# Patient Record
Sex: Female | Born: 1975 | Race: Black or African American | Hispanic: No | Marital: Married | Smoking: Former smoker
Health system: Southern US, Community
[De-identification: ages and names within clinical notes are randomized; demographics above are authoritative.]

## PROBLEM LIST (undated history)

## (undated) DIAGNOSIS — M797 Fibromyalgia: Secondary | ICD-10-CM

## (undated) DIAGNOSIS — R1013 Epigastric pain: Secondary | ICD-10-CM

## (undated) DIAGNOSIS — J45909 Unspecified asthma, uncomplicated: Secondary | ICD-10-CM

## (undated) DIAGNOSIS — I639 Cerebral infarction, unspecified: Secondary | ICD-10-CM

## (undated) HISTORY — PX: DILATION AND CURETTAGE OF UTERUS: SHX78

## (undated) HISTORY — DX: Epigastric pain: R10.13

---

## 2013-08-11 ENCOUNTER — Emergency Department (HOSPITAL_COMMUNITY)
Admission: EM | Admit: 2013-08-11 | Discharge: 2013-08-11 | Disposition: A | Discharge status: Home / Self Care | Payer: Self-pay | Attending: Emergency Medicine | Admitting: Emergency Medicine

## 2013-08-11 ENCOUNTER — Encounter (HOSPITAL_COMMUNITY): Payer: Self-pay | Admitting: Emergency Medicine

## 2013-08-11 DIAGNOSIS — R509 Fever, unspecified: Secondary | ICD-10-CM | POA: Insufficient documentation

## 2013-08-11 DIAGNOSIS — J029 Acute pharyngitis, unspecified: Secondary | ICD-10-CM | POA: Insufficient documentation

## 2013-08-11 DIAGNOSIS — Z8673 Personal history of transient ischemic attack (TIA), and cerebral infarction without residual deficits: Secondary | ICD-10-CM | POA: Insufficient documentation

## 2013-08-11 DIAGNOSIS — Z3202 Encounter for pregnancy test, result negative: Secondary | ICD-10-CM | POA: Insufficient documentation

## 2013-08-11 DIAGNOSIS — H65199 Other acute nonsuppurative otitis media, unspecified ear: Secondary | ICD-10-CM | POA: Insufficient documentation

## 2013-08-11 DIAGNOSIS — J45909 Unspecified asthma, uncomplicated: Secondary | ICD-10-CM | POA: Insufficient documentation

## 2013-08-11 DIAGNOSIS — R5381 Other malaise: Secondary | ICD-10-CM | POA: Insufficient documentation

## 2013-08-11 DIAGNOSIS — R197 Diarrhea, unspecified: Secondary | ICD-10-CM | POA: Insufficient documentation

## 2013-08-11 DIAGNOSIS — J3489 Other specified disorders of nose and nasal sinuses: Secondary | ICD-10-CM | POA: Insufficient documentation

## 2013-08-11 DIAGNOSIS — J069 Acute upper respiratory infection, unspecified: Secondary | ICD-10-CM | POA: Insufficient documentation

## 2013-08-11 DIAGNOSIS — Z79899 Other long term (current) drug therapy: Secondary | ICD-10-CM | POA: Insufficient documentation

## 2013-08-11 HISTORY — DX: Cerebral infarction, unspecified: I63.9

## 2013-08-11 HISTORY — DX: Unspecified asthma, uncomplicated: J45.909

## 2013-08-11 LAB — COMPREHENSIVE METABOLIC PANEL
AST: 16 U/L (ref 0–37)
Albumin: 4.4 g/dL (ref 3.5–5.2)
Alkaline Phosphatase: 88 U/L (ref 39–117)
BUN: 9 mg/dL (ref 6–23)
CO2: 23 mEq/L (ref 19–32)
Calcium: 9.7 mg/dL (ref 8.4–10.5)
Chloride: 105 mEq/L (ref 96–112)
GFR calc non Af Amer: >90 mL/min (ref >90)
Glucose, Bld: 110 mg/dL — ABNORMAL HIGH (ref 70–99)
Potassium: 4 mEq/L (ref 3.5–5.1)
Total Bilirubin: 0.7 mg/dL (ref 0.3–1.2)

## 2013-08-11 LAB — CBC WITH DIFFERENTIAL/PLATELET
Basophils Relative: 0 % (ref 0–1)
Hemoglobin: 13.1 g/dL (ref 12.0–15.0)
Lymphocytes Relative: 18 % (ref 12–46)
Lymphs Abs: 1.9 10*3/uL (ref 0.7–4.0)
MCHC: 33.5 g/dL (ref 30.0–36.0)
MCV: 96.8 fL (ref 78.0–100.0)
Monocytes Relative: 6 % (ref 3–12)
Neutro Abs: 7.7 10*3/uL (ref 1.7–7.7)
Neutrophils Relative %: 73 % (ref 43–77)
RBC: 4.04 MIL/uL (ref 3.87–5.11)
RDW: 13.1 % (ref 11.5–15.5)
WBC: 10.5 10*3/uL (ref 4.0–10.5)

## 2013-08-11 LAB — URINALYSIS, ROUTINE W REFLEX MICROSCOPIC
Bilirubin Urine: NEGATIVE
Glucose, UA: NEGATIVE mg/dL
Ketones, ur: NEGATIVE mg/dL
Protein, ur: NEGATIVE mg/dL
Urobilinogen, UA: 2 mg/dL — ABNORMAL HIGH (ref 0.0–1.0)
pH: 7 (ref 5.0–8.0)

## 2013-08-11 LAB — URINE MICROSCOPIC-ADD ON

## 2013-08-11 LAB — LIPASE, BLOOD: Lipase: 30 U/L (ref 11–59)

## 2013-08-11 MED ORDER — ALBUTEROL SULFATE HFA 108 (90 BASE) MCG/ACT IN AERS
2.0000 | INHALATION_SPRAY | Freq: Once | RESPIRATORY_TRACT | Status: AC
  Administered 2013-08-11: 2 via RESPIRATORY_TRACT
  Filled 2013-08-11: qty 6.7

## 2013-08-11 MED ORDER — ALBUTEROL SULFATE HFA 108 (90 BASE) MCG/ACT IN AERS: 1.0000 | INHALATION_SPRAY | Freq: Four times a day (QID) | RESPIRATORY_TRACT | Status: DC | PRN

## 2013-08-11 MED ORDER — FLUTICASONE PROPIONATE 50 MCG/ACT NA SUSP: 2.0000 | Freq: Every day | NASAL | Status: DC

## 2013-08-11 MED ORDER — ALBUTEROL SULFATE HFA 108 (90 BASE) MCG/ACT IN AERS: 2.0000 | INHALATION_SPRAY | Freq: Once | RESPIRATORY_TRACT | Status: DC

## 2013-08-11 NOTE — ED Notes (Signed)
Pt c/o cough, sore throat and congestion started two days ago then this morning she had diarrhea and nausea. Pt denies abd pain.

## 2013-08-11 NOTE — ED Provider Notes (Signed)
CSN: [id number]     Arrival date & time 08/11/13  1004 History   First MD Initiated Contact with Patient 08/11/13 1047     Chief Complaint  Patient presents with  . Diarrhea  . Nausea  . Cough  . Nasal Congestion   (Consider location/radiation/quality/duration/timing/severity/associated sxs/prior Treatment) Patient is a 37 y.o. female presenting with cough. The history is provided by the patient. No language interpreter was used.  Cough Cough characteristics:  Productive Sputum characteristics:  Nondescript Severity:  Mild Onset quality:  Gradual Duration:  2 days Timing:  Intermittent Relieved by:  None tried Associated symptoms: fever and sinus congestion   Associated symptoms: no chills, no shortness of breath and no wheezing    Pt is a 37 year old female who presents with a 2 day history of cough, nasal congestion and ear fullness. She reports that this all started with nasal congestion and a cough that is worse at night time. She denies any wheezing event though her cough feels tight. She reports a mild sore throat and post-nasal drip. No difficulty swallowing or shortness of breath. She thinks she may have had a fever but no reported chills. She has an all-over feeling of malaise and has been eating and drinking in her normal pattern. She had one episode of diarrhea this morning. She denies abdominal pain, nausea or vomiting. No bloody emesis or blood in her stool.    Past Medical History  Diagnosis Date  . Asthma   . Stroke    Past Surgical History  Procedure Laterality Date  . Dilation and curettage of uterus     No family history on file. History  Substance Use Topics  . Smoking status: Never Smoker   . Smokeless tobacco: Never Used  . Alcohol Use: Not on file     Comment: occasion   OB History   Grav Para Term Preterm Abortions TAB SAB Ect Mult Living                 Review of Systems  Constitutional: Positive for fever. Negative for chills.  Respiratory:  Positive for cough. Negative for chest tightness, shortness of breath, wheezing and stridor.   Gastrointestinal: Positive for diarrhea. Negative for nausea, vomiting, abdominal pain, constipation and abdominal distention.  Genitourinary: Negative for dysuria.  All other systems reviewed and are negative.    Allergies  Benadryl  Home Medications   Current Outpatient Rx  Name  Route  Sig  Dispense  Refill  . OVER THE COUNTER MEDICATION   Oral   Take 1 capsule by mouth as needed (Cold and flu symptoms.). Over the counter cold and flu medication.         . phentermine 37.5 MG capsule   Oral   Take 37.5 mg by mouth every morning.         . topiramate (TOPAMAX) 100 MG tablet   Oral   Take 100 mg by mouth at bedtime.          BP 147/90  Pulse 87  Temp(Src) 98.9 F (37.2 C) (Oral)  Resp 19  SpO2 96%  LMP 07/30/2013 Physical Exam  Nursing note and vitals reviewed. Constitutional: She appears well-developed and well-nourished. No distress.  HENT:  Head: Normocephalic and atraumatic.  Nose: Mucosal edema present.  Mouth/Throat: Uvula is midline. Posterior oropharyngeal erythema present.  Serous fluid behind TM's bilaterally.  Eyes: Conjunctivae and EOM are normal.  Neck: Normal range of motion. Neck supple. No JVD present. No tracheal  deviation present. No thyromegaly present.  Cardiovascular: Normal rate, regular rhythm, normal heart sounds and intact distal pulses.   Pulmonary/Chest: Effort normal and breath sounds normal. No respiratory distress. She has no wheezes.  Abdominal: Soft. Bowel sounds are normal. She exhibits no distension. There is no tenderness.    ED Course  Procedures (including critical care time) Labs Review Labs Reviewed  COMPREHENSIVE METABOLIC PANEL - Abnormal; Notable for the following:    Glucose, Bld 110 (*)    All other components within normal limits  CBC WITH DIFFERENTIAL  LIPASE, BLOOD  URINALYSIS, ROUTINE W REFLEX MICROSCOPIC  POCT  PREGNANCY, URINE   Imaging Review No results found.  EKG Interpretation   None       MDM   1. URI (upper respiratory infection)     Afebrile, nasal congestion and general malaise. No wheezing or shortness of breath. Increased cough at night time. Probable URI. Flonase as directed and albuterol as needed. Use of humidifier at home may be helpful. Discussed plan of care and pt agreed. Return if symptoms worsen or no gradual improvement. Increase oral fluids and rest.    Irish Elders, NP 08/11/13 1546

## 2013-08-11 NOTE — Progress Notes (Signed)
[facility] provided pt with a list of primary care resources. Patient stated that she just moved to [facility] and was pending insurance through her job.

## 2013-08-12 LAB — URINE CULTURE

## 2013-08-14 NOTE — ED Provider Notes (Signed)
Medical screening examination/treatment/procedure(s) were performed by non-physician practitioner and as supervising physician I was immediately available for consultation/collaboration.  EKG Interpretation   None        Toy Baker, MD 08/14/13 [phone]

## 2013-12-03 ENCOUNTER — Emergency Department (HOSPITAL_COMMUNITY)
Admission: EM | Admit: 2013-12-03 | Discharge: 2013-12-03 | Disposition: A | Discharge status: Home / Self Care | Payer: Self-pay | Attending: Emergency Medicine | Admitting: Emergency Medicine

## 2013-12-03 ENCOUNTER — Encounter (HOSPITAL_COMMUNITY): Payer: Self-pay | Admitting: Emergency Medicine

## 2013-12-03 DIAGNOSIS — J069 Acute upper respiratory infection, unspecified: Secondary | ICD-10-CM | POA: Insufficient documentation

## 2013-12-03 DIAGNOSIS — Z79899 Other long term (current) drug therapy: Secondary | ICD-10-CM | POA: Insufficient documentation

## 2013-12-03 DIAGNOSIS — R059 Cough, unspecified: Secondary | ICD-10-CM | POA: Insufficient documentation

## 2013-12-03 DIAGNOSIS — Z888 Allergy status to other drugs, medicaments and biological substances status: Secondary | ICD-10-CM | POA: Insufficient documentation

## 2013-12-03 DIAGNOSIS — R6889 Other general symptoms and signs: Secondary | ICD-10-CM | POA: Insufficient documentation

## 2013-12-03 DIAGNOSIS — Z8673 Personal history of transient ischemic attack (TIA), and cerebral infarction without residual deficits: Secondary | ICD-10-CM | POA: Insufficient documentation

## 2013-12-03 DIAGNOSIS — J029 Acute pharyngitis, unspecified: Secondary | ICD-10-CM

## 2013-12-03 DIAGNOSIS — J45909 Unspecified asthma, uncomplicated: Secondary | ICD-10-CM | POA: Insufficient documentation

## 2013-12-03 DIAGNOSIS — R05 Cough: Secondary | ICD-10-CM | POA: Insufficient documentation

## 2013-12-03 DIAGNOSIS — H9209 Otalgia, unspecified ear: Secondary | ICD-10-CM | POA: Insufficient documentation

## 2013-12-03 DIAGNOSIS — J3489 Other specified disorders of nose and nasal sinuses: Secondary | ICD-10-CM | POA: Insufficient documentation

## 2013-12-03 MED ORDER — HYDROCODONE-ACETAMINOPHEN 7.5-325 MG/15ML PO SOLN: 10.0000 mL | Freq: Four times a day (QID) | ORAL | Status: DC | PRN

## 2013-12-03 NOTE — ED Provider Notes (Signed)
CSN: [id number]     Arrival date & time 12/03/13  1858 History  This chart was scribed for non-physician practitioner, Elpidio Anis, PA-C working with Merrie Roof, MD by Greggory Stallion, ED scribe. This patient was seen in room TR07C/TR07C and the patient's care was started at 7:47 PM.   Chief Complaint  Patient presents with  . Sore Throat  . Cough   The history is provided by the patient. No language interpreter was used.   HPI Comments: Marissa Benton is a 38 y.o. female who presents to the Emergency Department complaining of rhinorrhea, congestion and sore throat that started 2 days ago. She states cough and hoarseness started earlier today. Pt has taken mucinex and ibuprofen with no relief. She is also having mild, bilateral ear pain. Denies ear discharge. Pt had a low grade fever at 99.5 when she checked in but denies other fevers.   Past Medical History  Diagnosis Date  . Asthma   . Stroke    Past Surgical History  Procedure Laterality Date  . Dilation and curettage of uterus     No family history on file. History  Substance Use Topics  . Smoking status: Never Smoker   . Smokeless tobacco: Never Used  . Alcohol Use: Not on file     Comment: occasion   OB History   Grav Para Term Preterm Abortions TAB SAB Ect Mult Living                 Review of Systems  Constitutional: Negative for fever.  HENT: Positive for congestion, ear pain, rhinorrhea and sore throat. Negative for ear discharge.   Respiratory: Positive for cough.   All other systems reviewed and are negative.   Allergies  Benadryl  Home Medications   Current Outpatient Rx  Name  Route  Sig  Dispense  Refill  . albuterol (PROVENTIL HFA;VENTOLIN HFA) 108 (90 BASE) MCG/ACT inhaler   Inhalation   Inhale 1-2 puffs into the lungs every 6 (six) hours as needed for wheezing or shortness of breath.   1 Inhaler   0   . fluticasone (FLONASE) 50 MCG/ACT nasal spray   Each Nare   Place 2 sprays into  both nostrils daily.   16 g   2   . OVER THE COUNTER MEDICATION   Oral   Take 1 capsule by mouth as needed (Cold and flu symptoms.). Over the counter cold and flu medication.         . phentermine 37.5 MG capsule   Oral   Take 37.5 mg by mouth every morning.         . topiramate (TOPAMAX) 100 MG tablet   Oral   Take 100 mg by mouth at bedtime.          BP 135/88  Pulse 99  Temp(Src) 99.5 F (37.5 C) (Oral)  Resp 20  SpO2 99%  LMP 11/21/2013  Physical Exam  Nursing note and vitals reviewed. Constitutional: She is oriented to person, place, and time. She appears well-developed and well-nourished. No distress.  HENT:  Head: Normocephalic and atraumatic.  Right Ear: Tympanic membrane and ear canal normal.  Left Ear: Tympanic membrane and ear canal normal.  Mouth/Throat: Uvula is midline. Posterior oropharyngeal erythema present. No oropharyngeal exudate or posterior oropharyngeal edema.  Eyes: EOM are normal.  Neck: Neck supple. No tracheal deviation present.  Cardiovascular: Normal rate, regular rhythm and normal heart sounds.   Pulmonary/Chest: Effort normal and breath sounds normal.  No respiratory distress. She has no wheezes. She has no rales.  Musculoskeletal: Normal range of motion.  Neurological: She is alert and oriented to person, place, and time.  Skin: Skin is warm and dry.  Psychiatric: She has a normal mood and affect. Her behavior is normal.    ED Course  Procedures (including critical care time)  DIAGNOSTIC STUDIES: Oxygen Saturation is 99% on RA, normal by my interpretation.    COORDINATION OF CARE: 7:50 PM-Discussed treatment plan which includes cough medication, tylenol and ibuprofen with pt at bedside and pt agreed to plan.   Labs Review Labs Reviewed - No data to display Imaging Review No results found.   EKG Interpretation None      MDM   Final diagnoses:  None    1. URI 2. Pharyngitis  Uncomplicated URI without evidence  bacterial infection of throat or sinuses. VSS. Supportive care.  I personally performed the services described in this documentation, which was scribed in my presence. The recorded information has been reviewed and is accurate.  Arnoldo HookerShari A Shylah Dossantos, PA-C 12/03/13 2017

## 2013-12-03 NOTE — ED Notes (Signed)
Onset yesterday runny nose-clear and thin; sore throat-painful to swallow.  Onset today cough and hoarseness.  Took Mucinex and Ibuprofen with no relief. No swallowing or respiratory difficulties.

## 2013-12-03 NOTE — Discharge Instructions (Signed)
Antibiotic Nonuse  Your caregiver felt that the infection or problem was not one that would be helped with an antibiotic. Infections may be caused by viruses or bacteria. Only a caregiver can tell which one of these is the likely cause of an illness. A cold is the most common cause of infection in both adults and children. A cold is a virus. Antibiotic treatment will have no effect on a viral infection. Viruses can lead to many lost days of work caring for sick children and many missed days of school. Children may catch as many as 10 "colds" or "flus" per year during which they can be tearful, cranky, and uncomfortable. The goal of treating a virus is aimed at keeping the ill person comfortable. Antibiotics are medications used to help the body fight bacterial infections. There are relatively few types of bacteria that cause infections but there are hundreds of viruses. While both viruses and bacteria cause infection they are very different types of germs. A viral infection will typically go away by itself within 7 to 10 days. Bacterial infections may spread or get worse without antibiotic treatment. Examples of bacterial infections are:  Sore throats (like strep throat or tonsillitis).  Infection in the lung (pneumonia).  Ear and skin infections. Examples of viral infections are:  Colds or flus.  Most coughs and bronchitis.  Sore throats not caused by Strep.  Runny noses. It is often best not to take an antibiotic when a viral infection is the cause of the problem. Antibiotics can kill off the helpful bacteria that we have inside our body and allow harmful bacteria to start growing. Antibiotics can cause side effects such as allergies, nausea, and diarrhea without helping to improve the symptoms of the viral infection. Additionally, repeated uses of antibiotics can cause bacteria inside of our body to become resistant. That resistance can be passed onto harmful bacterial. The next time you have  an infection it may be harder to treat if antibiotics are used when they are not needed. Not treating with antibiotics allows our own immune system to develop and take care of infections more efficiently. Also, antibiotics will work better for [place] when they are prescribed for bacterial infections. Treatments for a child that is ill may include:  Give extra fluids throughout the day to stay hydrated.  Get plenty of rest.  Only give your child over-the-counter or prescription medicines for pain, discomfort, or fever as directed by your caregiver.  The use of a cool mist humidifier may help stuffy noses.  Cold medications if suggested by your caregiver. Your caregiver may decide to start you on an antibiotic if:  The problem you were seen for today continues for a longer length of time than expected.  You develop a secondary bacterial infection. SEEK MEDICAL CARE IF:  Fever lasts longer than 5 days.  Symptoms continue to get worse after 5 to 7 days or become severe.  Difficulty in breathing develops.  Signs of dehydration develop (poor drinking, rare urinating, dark colored urine).  Changes in behavior or worsening tiredness (listlessness or lethargy). Document Released: 10/26/2001 Document Revised: 11/09/2011 Document Reviewed: 04/24/2009 [facility] Patient Information 2014 [place], [place]. Sore Throat A sore throat is pain, burning, irritation, or scratchiness of the throat. There is often pain or tenderness when swallowing or talking. A sore throat may be accompanied by other symptoms, such as coughing, sneezing, fever, and swollen neck glands. A sore throat is often the first sign of another sickness, such as a  cold, flu, strep throat, or mononucleosis (commonly known as mono). Most sore throats go away without medical treatment. CAUSES  The most common causes of a sore throat include:  A viral infection, such as a cold, flu, or mono.  A bacterial infection, such as strep throat,  tonsillitis, or whooping cough.  Seasonal allergies.  Dryness in the air.  Irritants, such as smoke or pollution.  Gastroesophageal reflux disease (GERD). HOME CARE INSTRUCTIONS   Only take over-the-counter medicines as directed by your caregiver.  Drink enough fluids to keep your urine clear or pale yellow.  Rest as needed.  Try using throat sprays, lozenges, or sucking on hard candy to ease any pain (if older than 4 years or as directed).  Sip warm liquids, such as broth, herbal tea, or warm water with honey to relieve pain temporarily. You may also eat or drink cold or frozen liquids such as frozen ice pops.  Gargle with salt water (mix 1 tsp salt with 8 oz of water).  Do not smoke and avoid secondhand smoke.  Put a cool-mist humidifier in your bedroom at night to moisten the air. You can also turn on a hot shower and sit in the bathroom with the door closed for 5 10 minutes. SEEK IMMEDIATE MEDICAL CARE IF:  You have difficulty breathing.  You are unable to swallow fluids, soft foods, or your saliva.  You have increased swelling in the throat.  Your sore throat does not get better in 7 days.  You have nausea and vomiting.  You have a fever or persistent symptoms for more than 2 3 days.  You have a fever and your symptoms suddenly get worse. MAKE SURE YOU:   Understand these instructions.  Will watch your condition.  Will get help right away if you are not doing well or get worse. Document Released: 09/24/2004 Document Revised: 08/03/2012 Document Reviewed: 04/24/2012 [facility] Patient Information 2014 [place], [place]. Salt Water Gargle This solution will help make your mouth and throat feel better. HOME CARE INSTRUCTIONS   Mix 1 teaspoon of salt in 8 ounces of warm water.  Gargle with this solution as much or often as you need or as directed. Swish and gargle gently if you have any sores or wounds in your mouth.  Do not swallow this mixture. Document  Released: 05/21/2004 Document Revised: 11/09/2011 Document Reviewed: 10/12/2008 [facility] Patient Information 2014 [place], [place]. Upper Respiratory Infection, Adult An upper respiratory infection (URI) is also known as the common cold. It is often caused by a type of germ (virus). Colds are easily spread (contagious). You can pass it to others by kissing, coughing, sneezing, or drinking out of the same glass. Usually, you get better in 1 or 2 weeks.  HOME CARE   Only take medicine as told by your doctor.  Use a warm mist humidifier or breathe in steam from a hot shower.  Drink enough water and fluids to keep your pee (urine) clear or pale yellow.  Get plenty of rest.  Return to work when your temperature is back to normal or as told by your doctor. You may use a face mask and wash your hands to stop your cold from spreading. GET HELP RIGHT AWAY IF:   After the first few days, you feel you are getting worse.  You have questions about your medicine.  You have chills, shortness of breath, or brown or red spit (mucus).  You have yellow or brown snot (nasal discharge) or pain in the  face, especially when you bend forward.  You have a fever, puffy (swollen) neck, pain when you swallow, or white spots in the back of your throat.  You have a bad headache, ear pain, sinus pain, or chest pain.  You have a high-pitched whistling sound when you breathe in and out (wheezing).  You have a lasting cough or cough up blood.  You have sore muscles or a stiff neck. MAKE SURE YOU:   Understand these instructions.  Will watch your condition.  Will get help right away if you are not doing well or get worse. Document Released: 02/03/2008 Document Revised: 11/09/2011 Document Reviewed: 12/22/2010 [facility] Patient Information 2014 [place].

## 2013-12-05 NOTE — ED Provider Notes (Signed)
Medical screening examination/treatment/procedure(s) were performed by non-physician practitioner and as supervising physician I was immediately available for consultation/collaboration.   Mitzy Naron David Braheem Tomasik III, MD 12/05/13 0942 

## 2014-02-14 ENCOUNTER — Emergency Department (HOSPITAL_COMMUNITY): Payer: Self-pay

## 2014-02-14 ENCOUNTER — Encounter (HOSPITAL_COMMUNITY): Payer: Self-pay | Admitting: Emergency Medicine

## 2014-02-14 ENCOUNTER — Emergency Department (HOSPITAL_COMMUNITY)
Admission: EM | Admit: 2014-02-14 | Discharge: 2014-02-14 | Disposition: A | Discharge status: Home / Self Care | Payer: Self-pay | Attending: Emergency Medicine | Admitting: Emergency Medicine

## 2014-02-14 DIAGNOSIS — R748 Abnormal levels of other serum enzymes: Secondary | ICD-10-CM | POA: Insufficient documentation

## 2014-02-14 DIAGNOSIS — R1011 Right upper quadrant pain: Secondary | ICD-10-CM | POA: Insufficient documentation

## 2014-02-14 DIAGNOSIS — J45909 Unspecified asthma, uncomplicated: Secondary | ICD-10-CM | POA: Insufficient documentation

## 2014-02-14 DIAGNOSIS — R112 Nausea with vomiting, unspecified: Secondary | ICD-10-CM | POA: Insufficient documentation

## 2014-02-14 DIAGNOSIS — R8271 Bacteriuria: Secondary | ICD-10-CM

## 2014-02-14 DIAGNOSIS — R82998 Other abnormal findings in urine: Secondary | ICD-10-CM | POA: Insufficient documentation

## 2014-02-14 DIAGNOSIS — Z8673 Personal history of transient ischemic attack (TIA), and cerebral infarction without residual deficits: Secondary | ICD-10-CM | POA: Insufficient documentation

## 2014-02-14 LAB — CBC WITH DIFFERENTIAL/PLATELET
BASOS PCT: 0 % (ref 0–1)
Basophils Absolute: 0 10*3/uL (ref 0.0–0.1)
EOS ABS: 0 10*3/uL (ref 0.0–0.7)
Eosinophils Relative: 1 % (ref 0–5)
HCT: 39.3 % (ref 36.0–46.0)
HEMOGLOBIN: 13.1 g/dL (ref 12.0–15.0)
Lymphocytes Relative: 24 % (ref 12–46)
Lymphs Abs: 1.9 10*3/uL (ref 0.7–4.0)
MCH: 32.5 pg (ref 26.0–34.0)
MCHC: 33.3 g/dL (ref 30.0–36.0)
MCV: 97.5 fL (ref 78.0–100.0)
MONOS PCT: 6 % (ref 3–12)
Monocytes Absolute: 0.5 10*3/uL (ref 0.1–1.0)
NEUTROS ABS: 5.6 10*3/uL (ref 1.7–7.7)
Neutrophils Relative %: 69 % (ref 43–77)
PLATELETS: 259 10*3/uL (ref 150–400)
RBC: 4.03 MIL/uL (ref 3.87–5.11)
RDW: 12.4 % (ref 11.5–15.5)
WBC: 8 10*3/uL (ref 4.0–10.5)

## 2014-02-14 LAB — URINALYSIS, ROUTINE W REFLEX MICROSCOPIC
Bilirubin Urine: NEGATIVE
Glucose, UA: NEGATIVE mg/dL
Ketones, ur: NEGATIVE mg/dL
Nitrite: NEGATIVE
PROTEIN: NEGATIVE mg/dL
SPECIFIC GRAVITY, URINE: 1.03 (ref 1.005–1.030)
UROBILINOGEN UA: 1 mg/dL (ref 0.0–1.0)
pH: 6.5 (ref 5.0–8.0)

## 2014-02-14 LAB — BASIC METABOLIC PANEL
BUN: 9 mg/dL (ref 6–23)
CO2: 20 mEq/L (ref 19–32)
Calcium: 9.3 mg/dL (ref 8.4–10.5)
Chloride: 101 mEq/L (ref 96–112)
Creatinine, Ser: 0.62 mg/dL (ref 0.50–1.10)
GFR calc Af Amer: >90 mL/min (ref >90)
Glucose, Bld: 97 mg/dL (ref 70–99)
Potassium: 4.3 mEq/L (ref 3.7–5.3)
Sodium: 138 mEq/L (ref 137–147)

## 2014-02-14 LAB — HEPATIC FUNCTION PANEL
ALT: 15 U/L (ref 0–35)
AST: 16 U/L (ref 0–37)
Albumin: 4.5 g/dL (ref 3.5–5.2)
Alkaline Phosphatase: 74 U/L (ref 39–117)
TOTAL PROTEIN: 8.2 g/dL (ref 6.0–8.3)
Total Bilirubin: 0.5 mg/dL (ref 0.3–1.2)

## 2014-02-14 LAB — LIPASE, BLOOD: Lipase: 99 U/L — ABNORMAL HIGH (ref 11–59)

## 2014-02-14 LAB — URINE MICROSCOPIC-ADD ON

## 2014-02-14 LAB — HCG, SERUM, QUALITATIVE: PREG SERUM: NEGATIVE

## 2014-02-14 MED ORDER — HYDROCODONE-ACETAMINOPHEN 5-325 MG PO TABS: ORAL_TABLET | ORAL | Status: DC

## 2014-02-14 MED ORDER — KETOROLAC TROMETHAMINE 10 MG PO TABS: 10.0000 mg | ORAL_TABLET | Freq: Four times a day (QID) | ORAL | Status: DC | PRN

## 2014-02-14 MED ORDER — KETOROLAC TROMETHAMINE 30 MG/ML IJ SOLN
15.0000 mg | Freq: Once | INTRAMUSCULAR | Status: DC
  Filled 2014-02-14: qty 1

## 2014-02-14 MED ORDER — HYDROCODONE-ACETAMINOPHEN 5-325 MG PO TABS
1.0000 | ORAL_TABLET | Freq: Once | ORAL | Status: AC
  Administered 2014-02-14: 1 via ORAL
  Filled 2014-02-14: qty 1

## 2014-02-14 MED ORDER — KETOROLAC TROMETHAMINE 15 MG/ML IJ SOLN
15.0000 mg | Freq: Once | INTRAMUSCULAR | Status: AC
  Administered 2014-02-14: 15 mg via INTRAVENOUS

## 2014-02-14 MED ORDER — ACETAMINOPHEN 325 MG PO TABS
975.0000 mg | ORAL_TABLET | Freq: Once | ORAL | Status: AC
  Administered 2014-02-14: 975 mg via ORAL
  Filled 2014-02-14: qty 3

## 2014-02-14 NOTE — ED Notes (Signed)
Pt returned from X-ray.  

## 2014-02-14 NOTE — ED Notes (Addendum)
Pt states that she is still in pain and discomfort.

## 2014-02-14 NOTE — ED Notes (Signed)
Pt transported to Xray. 

## 2014-02-14 NOTE — ED Notes (Signed)
Pt reports pain to RUQ, under her breast that started on Sunday. Reports its tender to touch and feels swollen but denies a bump or abscess. Denies fever/chills. Mild nausea, no vomiting or diarrhea.

## 2014-02-14 NOTE — Discharge Planning (Signed)
[facility] Community Liaison  Spoke to patient about primary care resources and establishing care with a provider. Patient sts she will be obtaining insurance through her husbands employment in the upcoming weeks, and will be looking for a primary care provider. Patient was given a Facilities managerresource guide and my contact information for any future questions or concerns. Pt was instructed to contact me if she needed assistance finding a provider who accepts her new insurance.

## 2014-02-14 NOTE — ED Provider Notes (Signed)
CSN: [id number]     Arrival date & time 02/14/14  0920 History   None    Chief Complaint  Patient presents with  . Pain     (Consider location/radiation/quality/duration/timing/severity/associated sxs/prior Treatment) HPI  Marissa Benton is a 38 y.o. female complaining of right lower rib right upper quadrant pain onset 3 days ago associated with single episode of nonbloody, nonbilious, coffee-ground emesis. Patient states the pain is exacerbated by palpation, deep breathing and eating. She has nausea and decreased by mouth intake. Denies fever, chills, change in bowel or bladder habits, shortness of breath, cough, exogenous estrogen, history of DVT or PE, leg pain or swelling. States that she feels a lump in the area. She's been taking naproxen at home with little relief.  Past Medical History  Diagnosis Date  . Asthma   . Stroke    Past Surgical History  Procedure Laterality Date  . Dilation and curettage of uterus     History reviewed. No pertinent family history. History  Substance Use Topics  . Smoking status: Never Smoker   . Smokeless tobacco: Never Used  . Alcohol Use: Yes     Comment: occasion   OB History   Grav Para Term Preterm Abortions TAB SAB Ect Mult Living                 Review of Systems    Allergies  Benadryl  Home Medications   Prior to Admission medications   Medication Sig Start Date End Date Taking? Authorizing Masiel Gentzler  naproxen sodium (ANAPROX) 220 MG tablet Take 220 mg by mouth 2 (two) times daily as needed (pain).   Yes Historical Shlonda Dolloff, MD  HYDROcodone-acetaminophen (NORCO/VICODIN) 5-325 MG per tablet Take 1-2 tablets by mouth every 6 hours as needed for pain. 02/14/14   Nicole Pisciotta, PA-C  ketorolac (TORADOL) 10 MG tablet Take 1 tablet (10 mg total) by mouth every 6 (six) hours as needed (Take with food. Do not take more than 4 per day. Do not take for longer than 5 days). 02/14/14   Nicole Pisciotta, PA-C   BP 133/105  Pulse 78   Temp(Src) 98.5 F (36.9 C) (Oral)  Resp 14  SpO2 100%  LMP 01/31/2014 Physical Exam  Nursing note and vitals reviewed. Constitutional: She is oriented to person, place, and time. She appears well-developed and well-nourished. No distress.  HENT:  Head: Normocephalic.  Eyes: Conjunctivae and EOM are normal. Pupils are equal, round, and reactive to light.  Cardiovascular: Normal rate, regular rhythm and intact distal pulses.   Pulmonary/Chest: Effort normal and breath sounds normal. No stridor. No respiratory distress. She has no wheezes. She has no rales. She exhibits tenderness.    Abdominal: Soft. Bowel sounds are normal. She exhibits no distension and no mass. There is tenderness. There is no rebound and no guarding.  Mild tenderness palpation right upper quadrant with no guarding or rebound  Musculoskeletal: Normal range of motion. She exhibits no edema and no tenderness.  No calf asymmetry, superficial collaterals, palpable cords, edema, Homans sign negative bilaterally.    Neurological: She is alert and oriented to person, place, and time.  Skin: No rash noted.  No rash or skin changes  Psychiatric: She has a normal mood and affect.    ED Course  Procedures (including critical care time) Labs Review Labs Reviewed  LIPASE, BLOOD - Abnormal; Notable for the following:    Lipase 99 (*)    All other components within normal limits  URINALYSIS, ROUTINE W  REFLEX MICROSCOPIC - Abnormal; Notable for the following:    Color, Urine AMBER (*)    APPearance CLOUDY (*)    Hgb urine dipstick TRACE (*)    Leukocytes, UA MODERATE (*)    All other components within normal limits  URINE CULTURE  HCG, SERUM, QUALITATIVE  CBC WITH DIFFERENTIAL  BASIC METABOLIC PANEL  HEPATIC FUNCTION PANEL  URINE MICROSCOPIC-ADD ON    Imaging Review Dg Chest 2 View  02/14/2014   CLINICAL DATA:  Right chest pain  EXAM: CHEST  2 VIEW  COMPARISON:  None.  FINDINGS: Normal mediastinum and cardiac  silhouette. Normal pulmonary vasculature. No evidence of effusion, infiltrate, or pneumothorax. No acute bony abnormality.  IMPRESSION: Normal chest radiograph.   Electronically Signed   By: Genevive BiStewart  Edmunds M.D.   On: 02/14/2014 12:39   [place] Abdomen Complete  02/14/2014   CLINICAL DATA:  Right upper quadrant pain  EXAM: ULTRASOUND ABDOMEN COMPLETE  COMPARISON:  None.  FINDINGS: Gallbladder:  No gallstones or wall thickening visualized. No sonographic Murphy sign noted.  Common bile duct:  Diameter: 4 mm in diameter within normal limits.  Liver:  No focal lesion identified. Within normal limits in parenchymal echogenicity.  IVC:  No abnormality visualized.  Pancreas:  Visualized portion unremarkable.  Spleen:  Size and appearance within normal limits.  5 cm in length.  Right Kidney:  Length: 10.4 cm. Echogenicity within normal limits. No mass or hydronephrosis visualized.  Left Kidney:  Length: 10.4 cm. Echogenicity within normal limits. No mass or hydronephrosis visualized.  Abdominal aorta:  No aneurysm visualized.  Measures up to 2 cm in diameter.  Other findings:  None.  IMPRESSION: Normal abdominal ultrasound.   Electronically Signed   By: Natasha MeadLiviu  Pop M.D.   On: 02/14/2014 14:24     EKG Interpretation   Date/Time:  Wednesday February 14 2014 11:36:43 EDT Ventricular Rate:  77 PR Interval:    QRS Duration: 71 QT Interval:  365 QTC Calculation: 413 R Axis:   48 Text Interpretation:  Normal sinus rhythm Artifact no signs of ST/T  changes No old tracing to compare Confirmed by GOLDSTON  MD, SCOTT [phone]([id number])  on 02/14/2014 12:34:16 PM      MDM   Final diagnoses:  Elevated lipase  RUQ pain  Asymptomatic bacteriuria   Filed Vitals:   02/14/14 1145 02/14/14 1200 02/14/14 1336 02/14/14 1500  BP: 113/62 118/81 117/78 133/105  Pulse: 64 67 65 78  Temp:      TempSrc:      Resp: 14 13 15 14   SpO2: 99% 100% 100% 100%    Medications  HYDROcodone-acetaminophen (NORCO/VICODIN) 5-325 MG per tablet 1  tablet (not administered)  acetaminophen (TYLENOL) tablet 975 mg (975 mg Oral Given 02/14/14 1159)  ketorolac (TORADOL) 15 MG/ML injection 15 mg (15 mg Intravenous Given 02/14/14 1334)    Marissa Benton is a 38 y.o. female presenting with 3 days of pain underneath the right breast and right upper quadrant exacerbated by palpation and eating. Single episode of vomiting. No systemic signs of infection.  Serial abdominal exams remained nonsurgical. EKG and chest x-ray unremarkable. Patient is low risk by Wells criteria and PERC negative. Patient has a nonspecific elevation in lipase was reading of 99. She is tolerating by mouth. Abdominal ultrasound pending.   Patient's lipase is elevated at 99, this may be mild pancreatitis. She is tolerating by mouth uncomfortable. I don't think she requires admission. Ultrasound shows no abnormalities in the biliary tree. Patient's urinalysis shows  moderate leukocytes however she denies dysuria, urinary frequency. Asymptomatic bacteriuria, will not treat at this time.  Discussed case with attending MD who agrees with plan and stability to d/c to home.   Evaluation does not show pathology that would require ongoing emergent intervention or inpatient treatment. Pt is hemodynamically stable and mentating appropriately. Discussed findings and plan with patient/guardian, who agrees with care plan. All questions answered. Return precautions discussed and outpatient follow up given.   New Prescriptions   HYDROCODONE-ACETAMINOPHEN (NORCO/VICODIN) 5-325 MG PER TABLET    Take 1-2 tablets by mouth every 6 hours as needed for pain.   KETOROLAC (TORADOL) 10 MG TABLET    Take 1 tablet (10 mg total) by mouth every 6 (six) hours as needed (Take with food. Do not take more than 4 per day. Do not take for longer than 5 days).         Wynetta Emery, PA-C 02/14/14 1523

## 2014-02-14 NOTE — Discharge Instructions (Signed)
Take vicodin for breakthrough pain, do not drink alcohol, drive, care for children or do other critical tasks while taking vicodin.   Do not combine ketorolac (toradol) with any other NSAID (motrin, ibuprofen, Advil, aleve , aspirin, naproxen etc.) Take fist ketorolac in 6 hours, you have already had a shot today.    Do not hesitate to return to the Emergency Department for any new, worsening or concerning symptoms.   If you do not have a primary care doctor you can establish one at the   [facility]: [address] [place] [id number] [phone]  After you establish care. Let them know you were seen in the emergency room. They must obtain records for further management.

## 2014-02-14 NOTE — ED Notes (Signed)
Pt is getting ready to go toXray

## 2014-02-15 LAB — URINE CULTURE: Colony Count: >=100000

## 2014-02-16 NOTE — ED Provider Notes (Signed)
Medical screening examination/treatment/procedure(s) were performed by non-physician practitioner and as supervising physician I was immediately available for consultation/collaboration.   EKG Interpretation   Date/Time:  Wednesday February 14 2014 11:36:43 EDT Ventricular Rate:  77 PR Interval:    QRS Duration: 71 QT Interval:  365 QTC Calculation: 413 R Axis:   48 Text Interpretation:  Normal sinus rhythm Artifact no signs of ST/T  changes No old tracing to compare Confirmed by Jamielyn Petrucci  MD, Garret Teale ([id number])  on 02/14/2014 12:34:16 PM        Audree CamelScott T Olman Yono, MD 02/16/14 [phone]

## 2014-09-17 ENCOUNTER — Encounter (HOSPITAL_COMMUNITY): Payer: Self-pay | Admitting: Emergency Medicine

## 2014-09-17 ENCOUNTER — Emergency Department (HOSPITAL_COMMUNITY)
Admission: EM | Admit: 2014-09-17 | Discharge: 2014-09-17 | Disposition: A | Discharge status: Home / Self Care | Payer: Self-pay | Attending: Emergency Medicine | Admitting: Emergency Medicine

## 2014-09-17 DIAGNOSIS — Z3202 Encounter for pregnancy test, result negative: Secondary | ICD-10-CM | POA: Insufficient documentation

## 2014-09-17 DIAGNOSIS — Y9389 Activity, other specified: Secondary | ICD-10-CM | POA: Insufficient documentation

## 2014-09-17 DIAGNOSIS — Z8673 Personal history of transient ischemic attack (TIA), and cerebral infarction without residual deficits: Secondary | ICD-10-CM | POA: Insufficient documentation

## 2014-09-17 DIAGNOSIS — S29012A Strain of muscle and tendon of back wall of thorax, initial encounter: Secondary | ICD-10-CM | POA: Insufficient documentation

## 2014-09-17 DIAGNOSIS — J45909 Unspecified asthma, uncomplicated: Secondary | ICD-10-CM | POA: Insufficient documentation

## 2014-09-17 DIAGNOSIS — Y998 Other external cause status: Secondary | ICD-10-CM | POA: Insufficient documentation

## 2014-09-17 DIAGNOSIS — S161XXA Strain of muscle, fascia and tendon at neck level, initial encounter: Secondary | ICD-10-CM | POA: Insufficient documentation

## 2014-09-17 DIAGNOSIS — X58XXXA Exposure to other specified factors, initial encounter: Secondary | ICD-10-CM | POA: Insufficient documentation

## 2014-09-17 DIAGNOSIS — Y9289 Other specified places as the place of occurrence of the external cause: Secondary | ICD-10-CM | POA: Insufficient documentation

## 2014-09-17 DIAGNOSIS — S39012A Strain of muscle, fascia and tendon of lower back, initial encounter: Secondary | ICD-10-CM

## 2014-09-17 LAB — POC URINE PREG, ED: Preg Test, Ur: NEGATIVE

## 2014-09-17 MED ORDER — MORPHINE SULFATE 4 MG/ML IJ SOLN
6.0000 mg | Freq: Once | INTRAMUSCULAR | Status: AC
  Administered 2014-09-17: 6 mg via INTRAMUSCULAR
  Filled 2014-09-17: qty 2

## 2014-09-17 MED ORDER — IBUPROFEN 400 MG PO TABS
600.0000 mg | ORAL_TABLET | Freq: Once | ORAL | Status: AC
  Administered 2014-09-17: 600 mg via ORAL
  Filled 2014-09-17 (×2): qty 1

## 2014-09-17 MED ORDER — DIAZEPAM 5 MG PO TABS
5.0000 mg | ORAL_TABLET | Freq: Once | ORAL | Status: AC
  Administered 2014-09-17: 5 mg via ORAL
  Filled 2014-09-17: qty 1

## 2014-09-17 MED ORDER — NAPROXEN 375 MG PO TABS: 375.0000 mg | ORAL_TABLET | Freq: Two times a day (BID) | ORAL | Status: DC | PRN

## 2014-09-17 MED ORDER — DIAZEPAM 5 MG PO TABS: 5.0000 mg | ORAL_TABLET | Freq: Two times a day (BID) | ORAL | Status: DC | PRN

## 2014-09-17 NOTE — ED Provider Notes (Signed)
CSN: [id number]     Arrival date & time 09/17/14  [id number] History   First MD Initiated Contact with Patient 09/17/14 [phone]     Chief Complaint  Patient presents with  . Back Pain     (Consider location/radiation/quality/duration/timing/severity/associated sxs/prior Treatment) HPI Comments: 39 year old female with asthma, occasional alcohol history presents with lower neck and mid back tenderness bilateral. Gradually worsening for the past few days. 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 No urinary symptoms, fevers or injuries.   Patient is a 39 y.o. female presenting with back pain. The history is provided by the patient.  Back Pain Associated symptoms: no abdominal pain, no chest pain, no dysuria, no fever, no headaches, no numbness and no weakness     Past Medical History  Diagnosis Date  . Asthma   . Stroke    Past Surgical History  Procedure Laterality Date  . Dilation and curettage of uterus     No family history on file. History  Substance Use Topics  . Smoking status: Never Smoker   . Smokeless tobacco: Never Used  . Alcohol Use: Yes     Comment: occasion   OB History    No data available     Review of Systems  Constitutional: Negative for fever and chills.  HENT: Negative for congestion.   Eyes: Negative for visual disturbance.  Respiratory: Negative for shortness of breath.   Cardiovascular: Negative for chest pain.  Gastrointestinal: Negative for vomiting and abdominal pain.  Genitourinary: Negative for dysuria and flank pain.  Musculoskeletal: Positive for back pain and neck pain. Negative for neck stiffness.  Skin: Negative for rash.  Neurological: Negative for weakness, light-headedness, numbness and headaches.      Allergies  Benadryl  Home Medications   Prior to Admission medications    Medication Sig Start Date End Date Taking? Authorizing Provider  acetaminophen (TYLENOL) 650 MG CR tablet Take 1,300 mg by mouth daily as needed for pain.   Yes Historical Provider, MD  diazepam (VALIUM) 5 MG tablet Take 1 tablet (5 mg total) by mouth 2 (two) times daily as needed for muscle spasms. 09/17/14   Enid SkeensJoshua M Freda Jaquith, MD  HYDROcodone-acetaminophen (NORCO/VICODIN) 5-325 MG per tablet Take 1-2 tablets by mouth every 6 hours as needed for pain. Patient not taking: Reported on 09/17/2014 02/14/14   Joni ReiningNicole Pisciotta, PA-C  ketorolac (TORADOL) 10 MG tablet Take 1 tablet (10 mg total) by mouth every 6 (six) hours as needed (Take with food. Do not take more than 4 per day. Do not take for longer than 5 days). Patient not taking: Reported on 09/17/2014 02/14/14   Joni ReiningNicole Pisciotta, PA-C  naproxen (NAPROSYN) 375 MG tablet Take 1 tablet (375 mg total) by mouth 2 (two) times daily as needed. 09/17/14   Enid SkeensJoshua M Lerae Langham, MD   BP 118/80 mmHg  Pulse 86  Temp(Src) 98.7 F (37.1 C) (Oral)  Resp 12  Ht 5\' 2"  (1.575 m)  Wt 190 lb (86.183 kg)  BMI 34.74 kg/m2  SpO2 95%  LMP 08/19/2013 Physical Exam  Constitutional: She is oriented to person, place, and time. She appears well-developed and well-nourished.  HENT:  Head: Normocephalic and atraumatic.  Eyes: Conjunctivae are normal. Right eye exhibits no discharge. Left eye exhibits no discharge.  Neck: Normal range of motion. Neck supple. No tracheal deviation present.  Cardiovascular: Normal rate.   Pulmonary/Chest: Effort normal.  Abdominal: Soft. She exhibits no distension. There is no tenderness. There is no guarding.  Musculoskeletal: She exhibits tenderness. She exhibits no edema.  Patient has no midline tenderness in cervical thoracic or lumbar spine. Patient has tight paraspinal musculature lower thoracic and lower cervical, full range motion head and neck no meningismus.  Neurological: She is alert and oriented to person, place, and time.  Reflex  Scores:      Patellar reflexes are 1+ on the right side and 1+ on the left side.      Achilles reflexes are 1+ on the right side and 1+ on the left side. 5+ strength in  LE with f/e at major joints. Sensation to palpation intact in UE and LE. CNs 2-12 grossly intact  Skin: Skin is warm. No rash noted.  Psychiatric: She has a normal mood and affect.  Nursing note and vitals reviewed.   ED Course  Procedures (including critical care time) Labs Review Labs Reviewed  POC URINE PREG, ED    Imaging Review No results found.   EKG Interpretation None      MDM   Final diagnoses:  Back strain, initial encounter   Overall healthy patient presents with clinically musculoskeletal neck and back pain. No red flags. Discussed supportive care with time off work, muscle relaxants, anti-inflammatory and discuss strict reasons to return. Patient and family comfortable this plan. Very low pretest prob for significant pathology at this time.  Pt has tolerated nsaids in the past.  Results and differential diagnosis were discussed with the patient/parent/guardian. Close follow up outpatient was discussed, comfortable with the plan.   Medications  diazepam (VALIUM) tablet 5 mg (5 mg Oral Given 09/17/14 0825)  morphine 4 MG/ML injection 6 mg (6 mg Intramuscular Given 09/17/14 0834)  ibuprofen (ADVIL,MOTRIN) tablet 600 mg (600 mg Oral Given 09/17/14 0935)    Filed Vitals:   09/17/14 0803 09/17/14 0833 09/17/14 0837 09/17/14 0930  BP:  139/98  118/80  Pulse:   90 86  Temp: 98.7 F (37.1 C)     TempSrc: Oral     Resp:   13 12  Height:      Weight:      SpO2:   98% 95%    Final diagnoses:  Back strain, initial encounter        Enid Skeens, MD 09/17/14 [phone]

## 2014-09-17 NOTE — ED Notes (Signed)
Pt states on Thursday she started having pain in between her shoulder blades that goes into both shoulders.This morning pain woke pt up and is now radiating into lower back also.

## 2014-09-17 NOTE — Discharge Instructions (Signed)
If you were given medicines take as directed.  If you are on coumadin or contraceptives realize their levels and effectiveness is altered by many different medicines.  If you have any reaction (rash, tongues swelling, other) to the medicines stop taking and see a physician.   Please follow up as directed and return to the ER or see a physician for new or worsening symptoms.  Thank you. Filed Vitals:   09/17/14 0749 09/17/14 0803 09/17/14 0833 09/17/14 0837  BP: 145/97  139/98   Pulse: 96   90  Temp:  98.7 F (37.1 C)    TempSrc:  Oral    Resp: 16   13  Height: 5\' 2"  (1.575 m)     Weight: 190 lb (86.183 kg)     SpO2: 100%   98%

## 2014-11-07 ENCOUNTER — Emergency Department (HOSPITAL_COMMUNITY)
Admission: EM | Admit: 2014-11-07 | Discharge: 2014-11-07 | Disposition: A | Discharge status: Home / Self Care | Payer: Self-pay | Attending: Emergency Medicine | Admitting: Emergency Medicine

## 2014-11-07 ENCOUNTER — Encounter (HOSPITAL_COMMUNITY): Payer: Self-pay | Admitting: Emergency Medicine

## 2014-11-07 DIAGNOSIS — K0889 Other specified disorders of teeth and supporting structures: Secondary | ICD-10-CM

## 2014-11-07 DIAGNOSIS — J45909 Unspecified asthma, uncomplicated: Secondary | ICD-10-CM | POA: Insufficient documentation

## 2014-11-07 DIAGNOSIS — Z8673 Personal history of transient ischemic attack (TIA), and cerebral infarction without residual deficits: Secondary | ICD-10-CM | POA: Insufficient documentation

## 2014-11-07 DIAGNOSIS — K029 Dental caries, unspecified: Secondary | ICD-10-CM | POA: Insufficient documentation

## 2014-11-07 DIAGNOSIS — K088 Other specified disorders of teeth and supporting structures: Secondary | ICD-10-CM | POA: Insufficient documentation

## 2014-11-07 MED ORDER — OXYCODONE-ACETAMINOPHEN 5-325 MG PO TABS: 1.0000 | ORAL_TABLET | ORAL | Status: DC | PRN

## 2014-11-07 MED ORDER — PENICILLIN V POTASSIUM 250 MG PO TABS
500.0000 mg | ORAL_TABLET | Freq: Once | ORAL | Status: AC
  Administered 2014-11-07: 500 mg via ORAL
  Filled 2014-11-07: qty 2

## 2014-11-07 MED ORDER — PENICILLIN V POTASSIUM 500 MG PO TABS: 500.0000 mg | ORAL_TABLET | Freq: Four times a day (QID) | ORAL | Status: DC

## 2014-11-07 MED ORDER — OXYCODONE-ACETAMINOPHEN 5-325 MG PO TABS
2.0000 | ORAL_TABLET | Freq: Once | ORAL | Status: AC
  Administered 2014-11-07: 2 via ORAL
  Filled 2014-11-07 (×2): qty 2

## 2014-11-07 MED ORDER — NAPROXEN 375 MG PO TABS: 375.0000 mg | ORAL_TABLET | Freq: Two times a day (BID) | ORAL | Status: DC | PRN

## 2014-11-07 NOTE — ED Provider Notes (Signed)
CSN: [id number]     Arrival date & time 11/07/14  [id number] History  This chart was scribed for Marisa Severinlga Barbar Brede, MD by Bronson CurbJacqueline Melvin, ED Scribe. This patient was seen in room B19C/B19C and the patient's care was started at 3:45 AM.   Chief Complaint  Patient presents with  . Otalgia    The history is provided by the patient. No language interpreter was used.     HPI Comments: Marissa Benton is a [age] y.o. female, with history of asthma, who presents to the Emergency Department complaining of constant, worsening, 9/10 left ear pain that radiates to the left jaw for the past 3 days. There is associated left sided facial swelling. Patient states she has been unable to chew. She has tried Naproxen without significant relief. She denies putting anything in her ear for treatment. Patient notes cold symptoms 2 weeks ago. She denies fever.    Past Medical History  Diagnosis Date  . Asthma   . Stroke    Past Surgical History  Procedure Laterality Date  . Dilation and curettage of uterus     No family history on file. History  Substance Use Topics  . Smoking status: Never Smoker   . Smokeless tobacco: Never Used  . Alcohol Use: Yes     Comment: occasion   OB History    No data available     Review of Systems  Constitutional: Negative for fever.  HENT: Positive for ear pain (left) and facial swelling.        Jaw pain      Allergies  Benadryl  Home Medications   Prior to Admission medications   Medication Sig Start Date End Date Taking? Authorizing Provider  acetaminophen (TYLENOL) 650 MG CR tablet Take 1,300 mg by mouth daily as needed for pain.    Historical Provider, MD  diazepam (VALIUM) 5 MG tablet Take 1 tablet (5 mg total) by mouth 2 (two) times daily as needed for muscle spasms. 09/17/14   Blane OharaJoshua Zavitz, MD  HYDROcodone-acetaminophen (NORCO/VICODIN) 5-325 MG per tablet Take 1-2 tablets by mouth every 6 hours as needed for pain. Patient not taking: Reported on 09/17/2014 02/14/14    Joni ReiningNicole Pisciotta, PA-C  ketorolac (TORADOL) 10 MG tablet Take 1 tablet (10 mg total) by mouth every 6 (six) hours as needed (Take with food. Do not take more than 4 per day. Do not take for longer than 5 days). Patient not taking: Reported on 09/17/2014 02/14/14   Joni ReiningNicole Pisciotta, PA-C  naproxen (NAPROSYN) 375 MG tablet Take 1 tablet (375 mg total) by mouth 2 (two) times daily as needed. 09/17/14   Blane OharaJoshua Zavitz, MD   Triage Vitals: BP 142/96 mmHg  Pulse 88  Temp(Src) 98.5 F (36.9 C) (Oral)  Resp 16  SpO2 98%  LMP 10/24/2014  Physical Exam  Constitutional: She is oriented to person, place, and time. She appears well-developed and well-nourished. No distress.  HENT:  Head: Normocephalic and atraumatic.  Right Ear: External ear normal.  Left Ear: External ear normal.  Nose: Nose normal.  Mouth/Throat: Oropharynx is clear and moist.  Lymphadenopathy under left jaw. Dental decay with swelling around 2nd molar left lower jaw  Eyes: Conjunctivae and EOM are normal. Pupils are equal, round, and reactive to light.  Neck: Normal range of motion. Neck supple. No JVD present. No tracheal deviation present. No thyromegaly present.  Cardiovascular: Normal rate, regular rhythm, normal heart sounds and intact distal pulses.  Exam reveals no gallop and no friction rub.  No murmur heard. Pulmonary/Chest: Effort normal and breath sounds normal. No stridor. No respiratory distress. She has no wheezes. She has no rales. She exhibits no tenderness.  Abdominal: Soft. Bowel sounds are normal. She exhibits no distension and no mass. There is no tenderness. There is no rebound and no guarding.  Musculoskeletal: Normal range of motion. She exhibits no edema or tenderness.  Lymphadenopathy:    She has no cervical adenopathy.  Neurological: She is alert and oriented to person, place, and time. She displays normal reflexes. She exhibits normal muscle tone. Coordination normal.  Skin: Skin is warm and dry. No  rash noted. No erythema. No pallor.  Psychiatric: She has a normal mood and affect. Her behavior is normal. Judgment and thought content normal.  Nursing note and vitals reviewed.   ED Course  Procedures (including critical care time)  DIAGNOSTIC STUDIES: Oxygen Saturation is 98% on room air, normal by my interpretation.    COORDINATION OF CARE: At [phone] Discussed treatment plan with patient which includes pain medication and ABX. Patient agrees.   Labs Review Labs Reviewed - No data to display  Imaging Review No results found.   EKG Interpretation None      MDM   Final diagnoses:  Pain, dental    39 year old female with left-sided face and ear pain, physical exam shows dental decay and most likely dental infection of second molar.  No signs of ear infection.  Plan for penicillin, Percocet and close follow-up with dentist.  I personally performed the services described in this documentation, which was scribed in my presence. The recorded information has been reviewed and is accurate.     Marisa Severin, MD 11/07/14 [phone]

## 2014-11-07 NOTE — ED Notes (Signed)
Pt presents with left ear pain onset Monday- pt admits pain radiates into left side of jaw and into mouth.

## 2014-11-07 NOTE — Discharge Instructions (Signed)
Dental Care and Dentist Visits °Dental care supports good overall health. Regular dental visits can also help you avoid dental pain, bleeding, infection, and other more serious health problems in the future. It is important to keep the mouth healthy because diseases in the teeth, gums, and other oral tissues can spread to other areas of the body. Some problems, such as diabetes, heart disease, and pre-term labor have been associated with poor oral health.  °See your dentist every 6 months. If you experience emergency problems such as a toothache or broken tooth, go to the dentist right away. If you see your dentist regularly, you may catch problems early. It is easier to be treated for problems in the early stages.  °WHAT TO EXPECT AT A DENTIST VISIT  °Your dentist will look for many common oral health problems and recommend proper treatment. At your regular dental visit, you can expect: °· Gentle cleaning of the teeth and gums. This includes scraping and polishing. This helps to remove the sticky substance around the teeth and gums (plaque). Plaque forms in the mouth shortly after eating. Over time, plaque hardens on the teeth as tartar. If tartar is not removed regularly, it can cause problems. Cleaning also helps remove stains. °· Periodic X-rays. These pictures of the teeth and supporting bone will help your dentist assess the health of your teeth. °· Periodic fluoride treatments. Fluoride is a natural mineral shown to help strengthen teeth. Fluoride treatment involves applying a fluoride gel or varnish to the teeth. It is most commonly done in children. °· Examination of the mouth, tongue, jaws, teeth, and gums to look for any oral health problems, such as: °· Cavities (dental caries). This is decay on the tooth caused by plaque, sugar, and acid in the mouth. It is best to catch a cavity when it is small. °· Inflammation of the gums caused by plaque buildup (gingivitis). °· Problems with the mouth or malformed  or misaligned teeth. °· Oral cancer or other diseases of the soft tissues or jaws.  °KEEP YOUR TEETH AND GUMS HEALTHY °For healthy teeth and gums, follow these general guidelines as well as your dentist's specific advice: °· Have your teeth professionally cleaned at the dentist every 6 months. °· Brush twice daily with a fluoride toothpaste. °· Floss your teeth daily.  °· Ask your dentist if you need fluoride supplements, treatments, or fluoride toothpaste. °· Eat a healthy diet. Reduce foods and drinks with added sugar. °· Avoid smoking. °TREATMENT FOR ORAL HEALTH PROBLEMS °If you have oral health problems, treatment varies depending on the conditions present in your teeth and gums. °· Your caregiver will most likely recommend good oral hygiene at each visit. °· For cavities, gingivitis, or other oral health disease, your caregiver will perform a procedure to treat the problem. This is typically done at a separate appointment. Sometimes your caregiver will refer you to another dental specialist for specific tooth problems or for surgery. °SEEK IMMEDIATE DENTAL CARE IF: °· You have pain, bleeding, or soreness in the gum, tooth, jaw, or mouth area. °· A permanent tooth becomes loose or separated from the gum socket. °· You experience a blow or injury to the mouth or jaw area. °Document Released: 04/29/2011 Document Revised: 11/09/2011 Document Reviewed: 04/29/2011 °ExitCare® Patient Information ©2015 [employer]. This information is not intended to replace advice given to you by your health care provider. Make sure you discuss any questions you have with your health care provider. ° °Dental Pain °A tooth ache may be caused   by cavities (tooth decay). Cavities expose the nerve of the tooth to air and hot or cold temperatures. It may come from an infection or abscess (also called a boil or furuncle) around your tooth. It is also often caused by dental caries (tooth decay). This causes the pain you are  having. DIAGNOSIS  Your caregiver can diagnose this problem by exam. TREATMENT   If caused by an infection, it may be treated with medications which kill germs (antibiotics) and pain medications as prescribed by your caregiver. Take medications as directed.  Only take over-the-counter or prescription medicines for pain, discomfort, or fever as directed by your caregiver.  Whether the tooth ache today is caused by infection or dental disease, you should see your dentist as soon as possible for further care. SEEK MEDICAL CARE IF: The exam and treatment you received today has been provided on an emergency basis only. This is not a substitute for complete medical or dental care. If your problem worsens or new problems (symptoms) appear, and you are unable to meet with your dentist, call or return to this location. SEEK IMMEDIATE MEDICAL CARE IF:   You have a fever.  You develop redness and swelling of your face, jaw, or neck.  You are unable to open your mouth.  You have severe pain uncontrolled by pain medicine. MAKE SURE YOU:   Understand these instructions.  Will watch your condition.  Will get help right away if you are not doing well or get worse. Document Released: 08/17/2005 Document Revised: 11/09/2011 Document Reviewed: 04/04/2008 [facility] Patient Information 2015 [place], [place]. This information is not intended to replace advice given to you by your health care provider. Make sure you discuss any questions you have with your health care provider.    Dental Care: Organization         Address  Phone  Notes  [facility] Department of [facility] [address] [place], [place] [phone] Accepts children up to age 77 who are enrolled in [place] or [place] Health Choice; pregnant women with a Medicaid card; and children who have applied for Medicaid or [place] Health Choice, but were declined, whose parents can pay a reduced fee at time of service.   [facility] Department of [facility]  [address], [place] [phone] Accepts children up to age 23 who are enrolled in [place] or [place] Health Choice; pregnant women with a Medicaid card; and children who have applied for Medicaid or [place] Health Choice, but were declined, whose parents can pay a reduced fee at time of service.  [facility]  [address] [place], [place] [phone] Patients are seen by appointment only. Walk-ins are not accepted. [facility] will see patients 73 years of age and older. Monday - Tuesday (8am-5pm) Most Wednesdays (8:30-5pm) $30 per visit, cash only  [facility]  [address], [facility] [phone] Patients are seen by appointment only. Walk-ins are not accepted. [facility] will see patients 74 years of age and older. One Wednesday Evening (Monthly: Volunteer Based).  $30 per visit, cash only  [employer]  [phone] for adults; Children under age 60, call Graduate Pediatric Dentistry at [phone]. Children aged 67-14, please call [phone] to request a pediatric application.  Dental services are provided in all areas of dental care including fillings, crowns and bridges, complete and partial dentures, implants, gum treatment,  root canals, and extractions. Preventive care is also provided. Treatment is provided to both adults and children. Patients are selected via a lottery and there is often a waiting list.   [facility] [address], [place]  [phone] [email or web address]   [facility] [address], [place], [place] [phone], Ext. [phone] Second and Fourth Thursday of each month, opens at 6:30 AM; Clinic ends at 9 AM.  Patients are seen on a first-come first-served basis, and a limited number are seen during each clinic.   [facility]  [address], [place], [place] [phone]  [phone]   Eligibility Requirements You must have lived in [place], [place], or [place] for at least the last three months.   You cannot be eligible for state or federal sponsored [place] insurance, including [employer], [place], or [employer]are.   You generally cannot be eligible for healthcare insurance through your employer.    How to apply: Eligibility screenings are held every Tuesday and Wednesday afternoon from 1:00 pm until 4:00 pm. You do not need an appointment for the interview!  [facility] [address], [place], [place] [phone]   [facility]  [phone]   [facility]  [phone]   [facility]  [phone]

## 2014-11-07 NOTE — ED Notes (Signed)
Pt a/o x 4 on d/c in wheelchair with family. 

## 2014-12-10 ENCOUNTER — Emergency Department (HOSPITAL_COMMUNITY)
Admission: EM | Admit: 2014-12-10 | Discharge: 2014-12-10 | Disposition: A | Discharge status: Home / Self Care | Payer: Self-pay | Attending: Emergency Medicine | Admitting: Emergency Medicine

## 2014-12-10 ENCOUNTER — Encounter (HOSPITAL_COMMUNITY): Payer: Self-pay | Admitting: Emergency Medicine

## 2014-12-10 DIAGNOSIS — R21 Rash and other nonspecific skin eruption: Secondary | ICD-10-CM | POA: Insufficient documentation

## 2014-12-10 DIAGNOSIS — Z791 Long term (current) use of non-steroidal anti-inflammatories (NSAID): Secondary | ICD-10-CM | POA: Insufficient documentation

## 2014-12-10 DIAGNOSIS — Z792 Long term (current) use of antibiotics: Secondary | ICD-10-CM | POA: Insufficient documentation

## 2014-12-10 DIAGNOSIS — Z8673 Personal history of transient ischemic attack (TIA), and cerebral infarction without residual deficits: Secondary | ICD-10-CM | POA: Insufficient documentation

## 2014-12-10 DIAGNOSIS — Z79899 Other long term (current) drug therapy: Secondary | ICD-10-CM | POA: Insufficient documentation

## 2014-12-10 DIAGNOSIS — J45909 Unspecified asthma, uncomplicated: Secondary | ICD-10-CM | POA: Insufficient documentation

## 2014-12-10 MED ORDER — DIPHENHYDRAMINE HCL 25 MG PO TABS: 50.0000 mg | ORAL_TABLET | ORAL | Status: DC | PRN

## 2014-12-10 MED ORDER — HYDROCORTISONE 1 % EX CREA: TOPICAL_CREAM | CUTANEOUS | Status: DC

## 2014-12-10 MED ORDER — RANITIDINE HCL 150 MG PO TABS: 150.0000 mg | ORAL_TABLET | Freq: Two times a day (BID) | ORAL | Status: DC

## 2014-12-10 NOTE — ED Notes (Signed)
Declined W/C at D/C and was escorted to lobby by RN. 

## 2014-12-10 NOTE — ED Notes (Signed)
Pt c/o red raised rash to B/L arms onset early last week. Pt tried neosporin which made symptoms worse. Pt reports clear drainage coming from rash.

## 2014-12-10 NOTE — ED Provider Notes (Signed)
CSN: [id number]     Arrival date & time 12/10/14  0725 History   First MD Initiated Contact with Patient 12/10/14 [id number]     Chief Complaint  Patient presents with  . Rash     (Consider location/radiation/quality/duration/timing/severity/associated sxs/prior Treatment) HPI Azriel Dancy is a 39 y.o. female because of her evaluation of right arm rash. Patient states she was playing outside in the woods with her kids and noticed on Saturday night a diffuse rash to her right arm. She denies any new laundry detergents, soaps, clothes, obvious exposures to insect bites. She characterizes the rash as intensely itchy and stings. She tried putting Neosporin on the rash, but that worsened the itchiness. She rates the itchiness as a 6/10. She denies any headache, nausea or vomiting, abdominal pain, throat or mouth swelling, difficulties breathing. No other aggravating or modifying factors.  Past Medical History  Diagnosis Date  . Asthma   . Stroke    Past Surgical History  Procedure Laterality Date  . Dilation and curettage of uterus     No family history on file. History  Substance Use Topics  . Smoking status: Never Smoker   . Smokeless tobacco: Never Used  . Alcohol Use: Yes     Comment: occasion   OB History    No data available     Review of Systems  Constitutional: Negative for fever.  Respiratory: Negative for shortness of breath.   Cardiovascular: Negative for chest pain.  Skin: Positive for rash.      Allergies  Benadryl  Home Medications   Prior to Admission medications   Medication Sig Start Date End Date Taking? Authorizing Provider  acetaminophen (TYLENOL) 650 MG CR tablet Take 1,300 mg by mouth daily as needed for pain.    Historical Provider, MD  diphenhydrAMINE (BENADRYL) 25 MG tablet Take 2 tablets (50 mg total) by mouth every 4 (four) hours as needed for itching. 12/10/14   Joycie Peek, PA-C  hydrocortisone cream 1 % Apply to affected area 2 times daily  12/10/14   Joycie Peek, PA-C  naproxen (NAPROSYN) 375 MG tablet Take 1 tablet (375 mg total) by mouth 2 (two) times daily as needed. 11/07/14   Marisa Severin, MD  oxyCODONE-acetaminophen (PERCOCET/ROXICET) 5-325 MG per tablet Take 1-2 tablets by mouth every 4 (four) hours as needed for severe pain. 11/07/14   Marisa Severin, MD  penicillin v potassium (VEETID) 500 MG tablet Take 1 tablet (500 mg total) by mouth 4 (four) times daily. 11/07/14   Marisa Severin, MD  ranitidine (ZANTAC) 150 MG tablet Take 1 tablet (150 mg total) by mouth 2 (two) times daily. 12/10/14   Joycie Peek, PA-C   BP 152/89 mmHg  Pulse 83  Temp(Src) 97.9 F (36.6 C) (Oral)  Resp 16  Ht  (1.575 m)  Wt 193 lb (87.544 kg)  BMI 35.29 kg/m2  SpO2 100%  LMP 11/26/2014 Physical Exam  Constitutional: She appears well-developed and well-nourished. No distress.  Awake, alert, nontoxic appearance.  HENT:  Head: Atraumatic.  Mouth/Throat: Oropharynx is clear and moist. No oropharyngeal exudate.  Eyes: Right eye exhibits no discharge. Left eye exhibits no discharge.  Neck: Neck supple.  Pulmonary/Chest: Effort normal. She exhibits no tenderness.  Abdominal: Soft. There is no tenderness. There is no rebound.  Musculoskeletal: She exhibits no tenderness.  Baseline ROM, no obvious new focal weakness.  Neurological:  Mental status and motor strength appears baseline for patient and situation.  Skin: Rash noted. She is not diaphoretic.  Diffuse macular papular rash with small, raised circumferential lesions throughout right upper extremity. No drainage or vesicles. No specific dermatomal pattern. No burrowing noted. Does not affect mucous membranes. No surrounding erythema, induration or warmth-no evidence of cellulitis.  Psychiatric: She has a normal mood and affect.  Nursing note and vitals reviewed.   ED Course  Procedures (including critical care time) Labs Review Labs Reviewed - No data to display  Imaging Review No  results found.   EKG Interpretation None     Filed Vitals:   12/10/14 0730 12/10/14 0732  BP:  152/89  Pulse:  83  Temp:  97.9 F (36.6 C)  TempSrc:  Oral  Resp:  16  Height: 5\' 2"  (1.575 m)   Weight: 193 lb (87.544 kg)   SpO2:  100%    MDM  Vitals stable - WNL -afebrile Pt resting comfortably in ED. PE--patient with nonspecific maculopapular rash to right upper extremity. No evidence of shingles, scabies, SJS, TENs, NF. Symptoms likely related to a contact dermatitis. No signs of anaphylaxis. Will treat empirically with hydrocortisone cream, oral antihistamines. Patient states she is able to take oral Benadryl, but cannot take IV Benadryl. Discussed washing with warm water and mild soaps, pat dry.  I discussed all relevant lab findings and imaging results with pt and they verbalized understanding. Discussed f/u with PCP within 48 hrs and return precautions, pt very amenable to plan.  Final diagnoses:  Rash        Joycie PeekBenjamin Onia Shiflett, PA-C 12/10/14 0800  Raeford RazorStephen Kohut, MD 12/10/14 [phone]

## 2014-12-10 NOTE — Discharge Instructions (Signed)
Allergies °Allergies may happen from anything your body is sensitive to. This may be food, medicines, pollens, chemicals, and nearly anything around you in everyday life that produces allergens. An allergen is anything that causes an allergy producing substance. Heredity is often a factor in causing these problems. This means you may have some of the same allergies as your parents. °Food allergies happen in all age groups. Food allergies are some of the most severe and life threatening. Some common food allergies are cow's milk, seafood, eggs, nuts, wheat, and soybeans. °SYMPTOMS  °· Swelling around the mouth. °· An itchy red rash or hives. °· Vomiting or diarrhea. °· Difficulty breathing. °SEVERE ALLERGIC REACTIONS ARE LIFE-THREATENING. °This reaction is called anaphylaxis. It can cause the mouth and throat to swell and cause difficulty with breathing and swallowing. In severe reactions only a trace amount of food (for example, peanut oil in a salad) may cause death within seconds. °Seasonal allergies occur in all age groups. These are seasonal because they usually occur during the same season every year. They may be a reaction to molds, grass pollens, or tree pollens. Other causes of problems are house dust mite allergens, pet dander, and mold spores. The symptoms often consist of nasal congestion, a runny itchy nose associated with sneezing, and tearing itchy eyes. There is often an associated itching of the mouth and ears. The problems happen when you come in contact with pollens and other allergens. Allergens are the particles in the air that the body reacts to with an allergic reaction. This causes you to release allergic antibodies. Through a chain of events, these eventually cause you to release histamine into the blood stream. Although it is meant to be protective to the body, it is this release that causes your discomfort. This is why you were given anti-histamines to feel better.  If you are unable to  pinpoint the offending allergen, it may be determined by skin or blood testing. Allergies cannot be cured but can be controlled with medicine. °Hay fever is a collection of all or some of the seasonal allergy problems. It may often be treated with simple over-the-counter medicine such as diphenhydramine. Take medicine as directed. Do not drink alcohol or drive while taking this medicine. Check with your caregiver or package insert for child dosages. °If these medicines are not effective, there are many new medicines your caregiver can prescribe. Stronger medicine such as nasal spray, eye drops, and corticosteroids may be used if the first things you try do not work well. Other treatments such as immunotherapy or desensitizing injections can be used if all else fails. Follow up with your caregiver if problems continue. These seasonal allergies are usually not life threatening. They are generally more of a nuisance that can often be handled using medicine. °HOME CARE INSTRUCTIONS  °· If unsure what causes a reaction, keep a diary of foods eaten and symptoms that follow. Avoid foods that cause reactions. °· If hives or rash are present: °· Take medicine as directed. °· You may use an over-the-counter antihistamine (diphenhydramine) for hives and itching as needed. °· Apply cold compresses (cloths) to the skin or take baths in cool water. Avoid hot baths or showers. Heat will make a rash and itching worse. °· If you are severely allergic: °· Following a treatment for a severe reaction, hospitalization is often required for closer follow-up. °· Wear a medic-alert bracelet or necklace stating the allergy. °· You and your family must learn how to give adrenaline or use   an anaphylaxis kit.  If you have had a severe reaction, always carry your anaphylaxis kit or EpiPen with you. Use this medicine as directed by your caregiver if a severe reaction is occurring. Failure to do so could have a fatal outcome. SEEK MEDICAL  CARE IF:  You suspect a food allergy. Symptoms generally happen within 30 minutes of eating a food.  Your symptoms have not gone away within 2 days or are getting worse.  You develop new symptoms.  You want to retest yourself or your child with a food or drink you think causes an allergic reaction. Never do this if an anaphylactic reaction to that food or drink has happened before. Only do this under the care of a caregiver. SEEK IMMEDIATE MEDICAL CARE IF:   You have difficulty breathing, are wheezing, or have a tight feeling in your chest or throat.  You have a swollen mouth, or you have hives, swelling, or itching all over your body.  You have had a severe reaction that has responded to your anaphylaxis kit or an EpiPen. These reactions may return when the medicine has worn off. These reactions should be considered life threatening. MAKE SURE YOU:   Understand these instructions.  Will watch your condition.  Will get help right away if you are not doing well or get worse. Document Released: 11/10/2002 Document Revised: 12/12/2012 Document Reviewed: 04/16/2008 [facility] [place] [facility] Patient Information 2015 [place], [place]. This information is not intended to replace advice given to you by your health care provider. Make sure you discuss any questions you have with your health care provider.  Contact Dermatitis Contact dermatitis is a reaction to certain substances that touch the skin. Contact dermatitis can be either irritant contact dermatitis or allergic contact dermatitis. Irritant contact dermatitis does not require previous exposure to the substance for a reaction to occur.Allergic contact dermatitis only occurs if you have been exposed to the substance before. Upon a repeat exposure, your body reacts to the substance.  CAUSES  Many substances can cause contact dermatitis. Irritant dermatitis is most commonly caused by repeated exposure to mildly irritating substances, such  as:  Makeup.  Soaps.  Detergents.  Bleaches.  Acids.  Metal salts, such as nickel. Allergic contact dermatitis is most commonly caused by exposure to:  Poisonous plants.  Chemicals (deodorants, shampoos).  Jewelry.  Latex.  Neomycin in triple antibiotic cream.  Preservatives in products, including clothing. SYMPTOMS  The area of skin that is exposed may develop:  Dryness or flaking.  Redness.  Cracks.  Itching.  Pain or a burning sensation.  Blisters. With allergic contact dermatitis, there may also be swelling in areas such as the eyelids, mouth, or genitals.  DIAGNOSIS  Your caregiver can usually tell what the problem is by doing a physical exam. In cases where the cause is uncertain and an allergic contact dermatitis is suspected, a patch skin test may be performed to help determine the cause of your dermatitis. TREATMENT Treatment includes protecting the skin from further contact with the irritating substance by avoiding that substance if possible. Barrier creams, powders, and gloves may be helpful. Your caregiver may also recommend:  Steroid creams or ointments applied 2 times daily. For best results, soak the rash area in cool water for 20 minutes. Then apply the medicine. Cover the area with a plastic wrap. You can store the steroid cream in the refrigerator for a "chilly" effect on your rash. That may decrease itching. Oral steroid medicines may be needed in more severe  cases.  Antibiotics or antibacterial ointments if a skin infection is present.  Antihistamine lotion or an antihistamine taken by mouth to ease itching.  Lubricants to keep moisture in your skin.  Burow's solution to reduce redness and soreness or to dry a weeping rash. Mix one packet or tablet of solution in 2 cups cool water. Dip a clean washcloth in the mixture, wring it out a bit, and put it on the affected area. Leave the cloth in place for 30 minutes. Do this as often as possible  throughout the day.  Taking several cornstarch or baking soda baths daily if the area is too large to cover with a washcloth. Harsh chemicals, such as alkalis or acids, can cause skin damage that is like a burn. You should flush your skin for 15 to 20 minutes with cold water after such an exposure. You should also seek immediate medical care after exposure. Bandages (dressings), antibiotics, and pain medicine may be needed for severely irritated skin.  HOME CARE INSTRUCTIONS  Avoid the substance that caused your reaction.  Keep the area of skin that is affected away from hot water, soap, sunlight, chemicals, acidic substances, or anything else that would irritate your skin.  Do not scratch the rash. Scratching may cause the rash to become infected.  You may take cool baths to help stop the itching.  Only take over-the-counter or prescription medicines as directed by your caregiver.  See your caregiver for follow-up care as directed to make sure your skin is healing properly. SEEK MEDICAL CARE IF:   Your condition is not better after 3 days of treatment.  You seem to be getting worse.  You see signs of infection such as swelling, tenderness, redness, soreness, or warmth in the affected area.  You have any problems related to your medicines. Document Released: 08/14/2000 Document Revised: 11/09/2011 Document Reviewed: 01/20/2011 [facility] Patient Information 2015 [address], [place]. This information is not intended to replace advice given to you by your health care provider. Make sure you discuss any questions you have with your health care provider.  Please take your medications as prescribed. Please follow-up with primary care for further evaluation management of your symptoms. Return to ED for new or worsening symptoms.

## 2014-12-17 ENCOUNTER — Encounter (HOSPITAL_COMMUNITY): Payer: Self-pay | Admitting: *Deleted

## 2014-12-17 ENCOUNTER — Emergency Department (HOSPITAL_COMMUNITY)
Admission: EM | Admit: 2014-12-17 | Discharge: 2014-12-17 | Disposition: A | Discharge status: Home / Self Care | Payer: Self-pay | Attending: Emergency Medicine | Admitting: Emergency Medicine

## 2014-12-17 DIAGNOSIS — Z79899 Other long term (current) drug therapy: Secondary | ICD-10-CM | POA: Insufficient documentation

## 2014-12-17 DIAGNOSIS — Z792 Long term (current) use of antibiotics: Secondary | ICD-10-CM | POA: Insufficient documentation

## 2014-12-17 DIAGNOSIS — Z7952 Long term (current) use of systemic steroids: Secondary | ICD-10-CM | POA: Insufficient documentation

## 2014-12-17 DIAGNOSIS — Z8673 Personal history of transient ischemic attack (TIA), and cerebral infarction without residual deficits: Secondary | ICD-10-CM | POA: Insufficient documentation

## 2014-12-17 DIAGNOSIS — R21 Rash and other nonspecific skin eruption: Secondary | ICD-10-CM | POA: Insufficient documentation

## 2014-12-17 DIAGNOSIS — J45909 Unspecified asthma, uncomplicated: Secondary | ICD-10-CM | POA: Insufficient documentation

## 2014-12-17 MED ORDER — METHYLPREDNISOLONE SODIUM SUCC 125 MG IJ SOLR
125.0000 mg | Freq: Once | INTRAMUSCULAR | Status: AC
  Administered 2014-12-17: 125 mg via INTRAMUSCULAR
  Filled 2014-12-17: qty 2

## 2014-12-17 MED ORDER — DOXYCYCLINE HYCLATE 100 MG PO CAPS: 100.0000 mg | ORAL_CAPSULE | Freq: Two times a day (BID) | ORAL | Status: DC

## 2014-12-17 MED ORDER — HYDROXYZINE HCL 25 MG PO TABS: 25.0000 mg | ORAL_TABLET | Freq: Four times a day (QID) | ORAL | Status: DC

## 2014-12-17 MED ORDER — PREDNISONE 10 MG PO TABS: 40.0000 mg | ORAL_TABLET | Freq: Every day | ORAL | Status: DC

## 2014-12-17 NOTE — ED Notes (Signed)
Pt presents today for a recheck of rash that started on 12-10-14. t reports the rash has not resolved.

## 2014-12-17 NOTE — Discharge Instructions (Signed)
Contact Dermatitis °Contact dermatitis is a reaction to certain substances that touch the skin. Contact dermatitis can be either irritant contact dermatitis or allergic contact dermatitis. Irritant contact dermatitis does not require previous exposure to the substance for a reaction to occur. Allergic contact dermatitis only occurs if you have been exposed to the substance before. Upon a repeat exposure, your body reacts to the substance.  °CAUSES  °Many substances can cause contact dermatitis. Irritant dermatitis is most commonly caused by repeated exposure to mildly irritating substances, such as: °· Makeup. °· Soaps. °· Detergents. °· Bleaches. °· Acids. °· Metal salts, such as nickel. °Allergic contact dermatitis is most commonly caused by exposure to: °· Poisonous plants. °· Chemicals (deodorants, shampoos). °· Jewelry. °· Latex. °· Neomycin in triple antibiotic cream. °· Preservatives in products, including clothing. °SYMPTOMS  °The area of skin that is exposed may develop: °· Dryness or flaking. °· Redness. °· Cracks. °· Itching. °· Pain or a burning sensation. °· Blisters. °With allergic contact dermatitis, there may also be swelling in areas such as the eyelids, mouth, or genitals.  °DIAGNOSIS  °Your caregiver can usually tell what the problem is by doing a physical exam. In cases where the cause is uncertain and an allergic contact dermatitis is suspected, a patch skin test may be performed to help determine the cause of your dermatitis. °TREATMENT °Treatment includes protecting the skin from further contact with the irritating substance by avoiding that substance if possible. Barrier creams, powders, and gloves may be helpful. Your caregiver may also recommend: °· Steroid creams or ointments applied 2 times daily. For best results, soak the rash area in cool water for 20 minutes. Then apply the medicine. Cover the area with a plastic wrap. You can store the steroid cream in the refrigerator for a "chilly"  effect on your rash. That may decrease itching. Oral steroid medicines may be needed in more severe cases. °· Antibiotics or antibacterial ointments if a skin infection is present. °· Antihistamine lotion or an antihistamine taken by mouth to ease itching. °· Lubricants to keep moisture in your skin. °· Burow's solution to reduce redness and soreness or to dry a weeping rash. Mix one packet or tablet of solution in 2 cups cool water. Dip a clean washcloth in the mixture, wring it out a bit, and put it on the affected area. Leave the cloth in place for 30 minutes. Do this as often as possible throughout the day. °· Taking several cornstarch or baking soda baths daily if the area is too large to cover with a washcloth. °Harsh chemicals, such as alkalis or acids, can cause skin damage that is like a burn. You should flush your skin for 15 to 20 minutes with cold water after such an exposure. You should also seek immediate medical care after exposure. Bandages (dressings), antibiotics, and pain medicine may be needed for severely irritated skin.  °HOME CARE INSTRUCTIONS °· Avoid the substance that caused your reaction. °· Keep the area of skin that is affected away from hot water, soap, sunlight, chemicals, acidic substances, or anything else that would irritate your skin. °· Do not scratch the rash. Scratching may cause the rash to become infected. °· You may take cool baths to help stop the itching. °· Only take over-the-counter or prescription medicines as directed by your caregiver. °· See your caregiver for follow-up care as directed to make sure your skin is healing properly. °SEEK MEDICAL CARE IF:  °· Your condition is not better after 3   days of treatment. °· You seem to be getting worse. °· You see signs of infection such as swelling, tenderness, redness, soreness, or warmth in the affected area. °· You have any problems related to your medicines. °Document Released: 08/14/2000 Document Revised: 11/09/2011  Document Reviewed: 01/20/2011 °[employer]® Patient Information ©2015 [employer]. This information is not intended to replace advice given to you by your health care provider. Make sure you discuss any questions you have with your health care provider. ° °Rash °A rash is a change in the color or texture of your skin. There are many different types of rashes. You may have other problems that accompany your rash. °CAUSES  °· Infections. °· Allergic reactions. This can include allergies to pets or foods. °· Certain medicines. °· Exposure to certain chemicals, soaps, or cosmetics. °· Heat. °· Exposure to poisonous plants. °· Tumors, both cancerous and noncancerous. °SYMPTOMS  °· Redness. °· Scaly skin. °· Itchy skin. °· Dry or cracked skin. °· Bumps. °· Blisters. °· Pain. °DIAGNOSIS  °Your caregiver may do a physical exam to determine what type of rash you have. A skin sample (biopsy) may be taken and examined under a microscope. °TREATMENT  °Treatment depends on the type of rash you have. Your caregiver may prescribe certain medicines. For serious conditions, you may need to see a skin doctor (dermatologist). °HOME CARE INSTRUCTIONS  °· Avoid the substance that caused your rash. °· Do not scratch your rash. This can cause infection. °· You may take cool baths to help stop itching. °· Only take over-the-counter or prescription medicines as directed by your caregiver. °· Keep all follow-up appointments as directed by your caregiver. °SEEK IMMEDIATE MEDICAL CARE IF: °· You have increasing pain, swelling, or redness. °· You have a fever. °· You have new or severe symptoms. °· You have body aches, diarrhea, or vomiting. °· Your rash is not better after 3 days. °MAKE SURE YOU: °· Understand these instructions. °· Will watch your condition. °· Will get help right away if you are not doing well or get worse. °Document Released: 08/07/2002 Document Revised: 11/09/2011 Document Reviewed: 06/01/2011 °[employer]® Patient Information  ©2015 [employer]. This information is not intended to replace advice given to you by your health care provider. Make sure you discuss any questions you have with your health care provider. ° °

## 2014-12-17 NOTE — ED Provider Notes (Signed)
CSN: [id number]     Arrival date & time 12/17/14  1191 History   First MD Initiated Contact with Patient 12/17/14 0830     Chief Complaint  Patient presents with  . Rash     (Consider location/radiation/quality/duration/timing/severity/associated sxs/prior Treatment) HPI    PCP: No PCP Per Patient Last menstrual period 11/26/2014.  Marissa Benton is a 39 y.o.female with a significant PMH of stroke and asthma presents to the ER with complaints of rash to entire body including the face. She was seen by an EDP 1 week ago for the same rash but it was only on her right anterior forearm. The rash started after she had been running through the woods. She is unsure of she was bit by anything. She was given topical hydrocortisone cream 1 %, ranitidine, Benadryl. She denies that it made the rash worse but the rash has continued to spread. It is painful and very pruritic. The patient has been scratching, and rubbing her eyes.  It has now spread onto her face, chest, back, abdomen, arms but does not pass the hips. The patient reports not being able to stop scratching even while we talk. She has not had any nausea, vomiting, diarrhea, fevers, weakness. Denies IV drug use, autoimmune disease. Pt reports with scratching clear fluid drains but has not had any blisters.   Past Medical History  Diagnosis Date  . Asthma   . Stroke    Past Surgical History  Procedure Laterality Date  . Dilation and curettage of uterus     History reviewed. No pertinent family history. History  Substance Use Topics  . Smoking status: Never Smoker   . Smokeless tobacco: Never Used  . Alcohol Use: Yes     Comment: occasion   OB History    No data available     Review of Systems  10 Systems reviewed and are negative for acute change except as noted in the HPI.   Allergies  Benadryl  Home Medications   Prior to Admission medications   Medication Sig Start Date End Date Taking? Authorizing Provider   acetaminophen (TYLENOL) 650 MG CR tablet Take 1,300 mg by mouth daily as needed for pain.    Historical Provider, MD  diphenhydrAMINE (BENADRYL) 25 MG tablet Take 2 tablets (50 mg total) by mouth every 4 (four) hours as needed for itching. 12/10/14   Joycie Peek, PA-C  doxycycline (VIBRAMYCIN) 100 MG capsule Take 1 capsule (100 mg total) by mouth 2 (two) times daily. 12/17/14   Marlon Pel, PA-C  hydrocortisone cream 1 % Apply to affected area 2 times daily 12/10/14   Joycie Peek, PA-C  hydrOXYzine (ATARAX/VISTARIL) 25 MG tablet Take 1 tablet (25 mg total) by mouth every 6 (six) hours. 12/17/14   Bayleigh Loflin Neva Seat, PA-C  naproxen (NAPROSYN) 375 MG tablet Take 1 tablet (375 mg total) by mouth 2 (two) times daily as needed. 11/07/14   Marisa Severin, MD  oxyCODONE-acetaminophen (PERCOCET/ROXICET) 5-325 MG per tablet Take 1-2 tablets by mouth every 4 (four) hours as needed for severe pain. 11/07/14   Marisa Severin, MD  penicillin v potassium (VEETID) 500 MG tablet Take 1 tablet (500 mg total) by mouth 4 (four) times daily. 11/07/14   Marisa Severin, MD  predniSONE (DELTASONE) 10 MG tablet Take 4 tablets (40 mg total) by mouth daily. 12/18/14   Alvilda Mckenna Neva Seat, PA-C  ranitidine (ZANTAC) 150 MG tablet Take 1 tablet (150 mg total) by mouth 2 (two) times daily. 12/10/14   Joycie Peek, PA-C  BP 131/93 mmHg  Pulse 76  Temp(Src) 97.9 F (36.6 C)  Resp 16  Ht 5\' 2"  (1.575 m)  Wt 199 lb 2 oz (90.323 kg)  BMI 36.41 kg/m2  SpO2 100%  LMP 11/26/2014   Physical Exam  Constitutional: She appears well-developed and well-nourished. No distress.  HENT:  Head: Normocephalic and atraumatic.  Eyes: Pupils are equal, round, and reactive to light.  Neck: Normal range of motion. Neck supple.  Cardiovascular: Normal rate and regular rhythm.   Pulmonary/Chest: Effort normal and breath sounds normal. She has no wheezes. She has no rhonchi.  Abdominal: Soft.  Musculoskeletal:  No joint swelling  Neurological: She is  alert.  Skin: Skin is warm and dry.  Erythematous folliculitis to arms, chest, back, face, and neck. They are excoriated. No fluid within the papules.  Nursing note and vitals reviewed.   ED Course  Procedures (including critical care time) Labs Review Labs Reviewed - No data to display  Imaging Review No results found.   EKG Interpretation None      MDM   Final diagnoses:  Rash    Pt does not have any systemic symptoms such as joint pain, joint swelling, fever, neck pain, headache, body aches, nausea, vomiting, diarrhea, wheezing, SOB.  Medications  methylPREDNISolone sodium succinate (SOLU-MEDROL) 125 mg/2 mL injection 125 mg (125 mg Intramuscular Given 12/17/14 0850)   Will treat with Doxy and Hydroxyzine. Will have her take a 40 mg daily 3 day steroid course. Referral to Derm given and strict return to ED precautions.  39 y.o.Arlenne Gassman's evaluation in the Emergency Department is complete. It has been determined that no acute conditions requiring further emergency intervention are present at this time. The patient/guardian have been advised of the diagnosis and plan. We have discussed signs and symptoms that warrant return to the ED, such as changes or worsening in symptoms.  Vital signs are stable at discharge. Filed Vitals:   12/17/14 0836  BP: 131/93  Pulse: 76  Temp: 97.9 F (36.6 C)  Resp: 16    Patient/guardian has voiced understanding and agreed to follow-up with the PCP or specialist.     Marlon Peliffany Elihue Ebert, PA-C 12/17/14 [id number]  Lorre NickAnthony Allen, MD 12/17/14 [phone]

## 2014-12-17 NOTE — ED Notes (Signed)
Declined W/C at D/C and was escorted to lobby by RN. 

## 2015-03-03 ENCOUNTER — Encounter (HOSPITAL_COMMUNITY): Payer: Self-pay | Admitting: Adult Health

## 2015-03-03 ENCOUNTER — Emergency Department (HOSPITAL_COMMUNITY)
Admission: EM | Admit: 2015-03-03 | Discharge: 2015-03-03 | Disposition: A | Discharge status: Home / Self Care | Payer: Self-pay | Attending: Emergency Medicine | Admitting: Emergency Medicine

## 2015-03-03 DIAGNOSIS — M62838 Other muscle spasm: Secondary | ICD-10-CM | POA: Insufficient documentation

## 2015-03-03 DIAGNOSIS — Z7952 Long term (current) use of systemic steroids: Secondary | ICD-10-CM | POA: Insufficient documentation

## 2015-03-03 DIAGNOSIS — Z792 Long term (current) use of antibiotics: Secondary | ICD-10-CM | POA: Insufficient documentation

## 2015-03-03 DIAGNOSIS — Z8673 Personal history of transient ischemic attack (TIA), and cerebral infarction without residual deficits: Secondary | ICD-10-CM | POA: Insufficient documentation

## 2015-03-03 DIAGNOSIS — Z79899 Other long term (current) drug therapy: Secondary | ICD-10-CM | POA: Insufficient documentation

## 2015-03-03 DIAGNOSIS — J45909 Unspecified asthma, uncomplicated: Secondary | ICD-10-CM | POA: Insufficient documentation

## 2015-03-03 LAB — CBC WITH DIFFERENTIAL/PLATELET
BASOS ABS: 0 10*3/uL (ref 0.0–0.1)
Basophils Relative: 0 % (ref 0–1)
Eosinophils Absolute: 0.1 10*3/uL (ref 0.0–0.7)
Eosinophils Relative: 1 % (ref 0–5)
HCT: 38.2 % (ref 36.0–46.0)
HEMOGLOBIN: 12.5 g/dL (ref 12.0–15.0)
LYMPHS ABS: 2.1 10*3/uL (ref 0.7–4.0)
Lymphocytes Relative: 22 % (ref 12–46)
MCH: 32.1 pg (ref 26.0–34.0)
MCHC: 32.7 g/dL (ref 30.0–36.0)
MCV: 98.2 fL (ref 78.0–100.0)
Monocytes Absolute: 0.7 10*3/uL (ref 0.1–1.0)
Monocytes Relative: 7 % (ref 3–12)
Neutro Abs: 6.6 10*3/uL (ref 1.7–7.7)
Neutrophils Relative %: 70 % (ref 43–77)
Platelets: 291 10*3/uL (ref 150–400)
RBC: 3.89 MIL/uL (ref 3.87–5.11)
RDW: 13.4 % (ref 11.5–15.5)
WBC: 9.5 10*3/uL (ref 4.0–10.5)

## 2015-03-03 LAB — BASIC METABOLIC PANEL
ANION GAP: 10 (ref 5–15)
BUN: 8 mg/dL (ref 6–20)
CALCIUM: 8.9 mg/dL (ref 8.9–10.3)
CO2: 24 mmol/L (ref 22–32)
CREATININE: 0.91 mg/dL (ref 0.44–1.00)
Chloride: 101 mmol/L (ref 101–111)
GFR calc non Af Amer: >60 mL/min (ref >60)
Glucose, Bld: 125 mg/dL — ABNORMAL HIGH (ref 65–99)
Potassium: 3.6 mmol/L (ref 3.5–5.1)
Sodium: 135 mmol/L (ref 135–145)

## 2015-03-03 MED ORDER — OXYCODONE-ACETAMINOPHEN 5-325 MG PO TABS: 1.0000 | ORAL_TABLET | Freq: Once | ORAL | Status: DC

## 2015-03-03 MED ORDER — KETOROLAC TROMETHAMINE 60 MG/2ML IM SOLN
30.0000 mg | Freq: Once | INTRAMUSCULAR | Status: AC
  Administered 2015-03-03: 30 mg via INTRAMUSCULAR
  Filled 2015-03-03: qty 2

## 2015-03-03 MED ORDER — DIAZEPAM 5 MG PO TABS: 5.0000 mg | ORAL_TABLET | Freq: Two times a day (BID) | ORAL | Status: DC

## 2015-03-03 MED ORDER — DIAZEPAM 5 MG/ML IJ SOLN
5.0000 mg | Freq: Once | INTRAMUSCULAR | Status: AC
  Administered 2015-03-03: 5 mg via INTRAMUSCULAR
  Filled 2015-03-03: qty 2

## 2015-03-03 MED ORDER — CYCLOBENZAPRINE HCL 10 MG PO TABS
5.0000 mg | ORAL_TABLET | Freq: Once | ORAL | Status: AC
  Administered 2015-03-03: 5 mg via ORAL
  Filled 2015-03-03: qty 1

## 2015-03-03 MED ORDER — IBUPROFEN 400 MG PO TABS: 400.0000 mg | ORAL_TABLET | Freq: Four times a day (QID) | ORAL | Status: DC | PRN

## 2015-03-03 NOTE — Discharge Instructions (Signed)
Return to the emergency room with worsening of symptoms, new symptoms or with symptoms that are concerning, especially fevers, redness, swelling, numbness, tingling, weakness. RICE: Rest, Ice (three cycles of 20 mins on, 20mins off at least twice a day), compression/brace, elevation. Heating pad works well for back pain. Ibuprofen 400mg  (2 tablets 200mg ) every 5-6 hours for 3-5 days. Drink plenty of water. Valium for severe pain. Do not operate machinery, drive or drink alcohol while taking narcotics or muscle relaxers including valium. Follow up with PCP to establish care. Please call your doctor for a followup appointment within 24-48 hours. When you talk to your doctor please let them know that you were seen in the emergency department and have them acquire all of your records so that they can discuss the findings with you and formulate a treatment plan to fully care for your new and ongoing problems. If you do not have a primary care provider please call the number below under ED resources to establish care with a provider and follow up.   Muscle Cramps and Spasms Muscle cramps and spasms occur when a muscle or muscles tighten and you have no control over this tightening (involuntary muscle contraction). They are a common problem and can develop in any muscle. The most common place is in the calf muscles of the leg. Both muscle cramps and muscle spasms are involuntary muscle contractions, but they also have differences:   Muscle cramps are sporadic and painful. They may last a few seconds to a quarter of an hour. Muscle cramps are often more forceful and last longer than muscle spasms.  Muscle spasms may or may not be painful. They may also last just a few seconds or much longer. CAUSES  It is uncommon for cramps or spasms to be due to a serious underlying problem. In many cases, the cause of cramps or spasms is unknown. Some common causes are:   Overexertion.   Overuse from repetitive  motions (doing the same thing over and over).   Remaining in a certain position for a long period of time.   Improper preparation, form, or technique while performing a sport or activity.   Dehydration.   Injury.   Side effects of some medicines.   Abnormally low levels of the salts and ions in your blood (electrolytes), especially potassium and calcium. This could happen if you are taking water pills (diuretics) or you are pregnant.  Some underlying medical problems can make it more likely to develop cramps or spasms. These include, but are not limited to:   Diabetes.   Parkinson disease.   Hormone disorders, such as thyroid problems.   Alcohol abuse.   Diseases specific to muscles, joints, and bones.   Blood vessel disease where not enough blood is getting to the muscles.  HOME CARE INSTRUCTIONS   Stay well hydrated. Drink enough water and fluids to keep your urine clear or pale yellow.  It may be helpful to massage, stretch, and relax the affected muscle.  For tight or tense muscles, use a warm towel, heating pad, or hot shower water directed to the affected area.  If you are sore or have pain after a cramp or spasm, applying ice to the affected area may relieve discomfort.  Put ice in a plastic bag.  Place a towel between your skin and the bag.  Leave the ice on for 15-20 minutes, 03-04 times a day.  Medicines used to treat a known cause of cramps or spasms may help  reduce their frequency or severity. Only take over-the-counter or prescription medicines as directed by your caregiver. SEEK MEDICAL CARE IF:  Your cramps or spasms get more severe, more frequent, or do not improve over time.  MAKE SURE YOU:   Understand these instructions.  Will watch your condition.  Will get help right away if you are not doing well or get worse. Document Released: 02/06/2002 Document Revised: 12/12/2012 Document Reviewed: 08/03/2012 [facility] Patient Information  2015 [place], [place]. This information is not intended to replace advice given to you by your health care provider. Make sure you discuss any questions you have with your health care provider.   Emergency Department Resource Guide 1) Find a Doctor and Pay Out of Pocket Although you won't have to find out who is covered by your insurance plan, it is a good idea to ask around and get recommendations. You will then need to call the office and see if the doctor you have chosen will accept you as a new patient and what types of options they offer for patients who are self-pay. Some doctors offer discounts or will set up payment plans for their patients who do not have insurance, but you will need to ask so you aren't surprised when you get to your appointment.  2) Contact Your Local Health Department Not all health departments have doctors that can see patients for sick visits, but many do, so it is worth a call to see if yours does. If you don't know where your local health department is, you can check in your phone book. The CDC also has a tool to help you locate your state's health department, and many state websites also have listings of all of their local health departments.  3) Find a Walk-in Clinic If your illness is not likely to be very severe or complicated, you may want to try a walk in clinic. These are popping up all over the country in pharmacies, drugstores, and shopping centers. They're usually staffed by nurse practitioners or physician assistants that have been trained to treat common illnesses and complaints. They're usually fairly quick and inexpensive. However, if you have serious medical issues or chronic medical problems, these are probably not your best option.  No Primary Care Doctor: - Call Health Connect at  [phone] - they can help you locate a primary care doctor that  accepts your insurance, provides certain services, etc. - Physician Referral Service- [phone]  Chronic  Pain Problems: Organization         Address  Phone   Notes  [facility]  [phone] Patients need to be referred by their primary care doctor.   Medication Assistance: Organization         Address  Phone   Notes  [facility] Medication [facility] [address] [place] [place], [place] [address] [phone] --Must be a resident of [facility] -- Must have NO insurance coverage whatsoever (no Medicaid/ Medicare, etc.) -- The pt. MUST have a primary care doctor that directs their care regularly and follows them in the community   [facility]  [phone]   [employer]  [phone]    Agencies that provide inexpensive medical care: Organization         Address  Phone   Notes  [facility]  [phone]   [facility]    [phone]   [facility] [address] [place], [place] [address] [phone]   [facility] [address], [place] [phone]   Planned Parenthood    [phone]   [facility]    [phone]   [facility]  [address], [place] Phone:  [phone], Fax:  [phone] Hours of Operation:  9 am - 6 pm, M-F.  Also accepts Medicaid/Medicare and self-pay.  [facility]  [address], [place] Phone: [phone], Fax: [phone]. Hours of Operation:  8:30 am - 5:30 pm, M-F.  Also accepts Medicaid and self-pay.  [facility] [place] [address], [place] Phone: [phone]   [facility] [address] Natasha Bence [place], [place] [phone], Ext. [phone] Mondays & Thursdays: 7-9 AM.  First 15 patients are seen on a first come, first serve basis.    Medicaid-accepting [facility] Providers:  Organization         Address  Phone   Notes  [facility] [address], [place] [phone] Also  accepts self-pay patients.  [facility] [address] Laurell Josephs [place], [place]  [phone]   [facility] [address], [place] [phone]   [facility] [address], [place] [phone]   Renaye Rakers [address], [place]   [phone] Only accepts [place] Access [place] patients after they have their name applied to their card.   Self-Pay (no insurance) in [facility]:  Organization         Address  Phone   Notes  Sickle Cell Patients, [facility] [address] [place], [place] [phone]   [facility] [address] [place], [place] [phone]   [facility]  [address], [place] [phone]   [facility]/Dr. Osei-Bonsu  [address] [place] or [address], [place] [phone] Phone number for both [place] and [place] locations is the same.  [facility] [address], [place] [phone]   [facility] [address] [place] or [address] [phone]   [facility] [address] [place] [phone], phone; [phone], fax Sees patients 1st and 3rd Saturday of every month.  Must not qualify for public or private insurance (i.e. Medicaid, Medicare, [place] Health Choice, Veterans' Benefits)  Household income should be no more than 200% of the poverty level The clinic cannot treat you if you are pregnant or think you are pregnant  Sexually transmitted diseases are not treated at the clinic.    Dental Care: Organization         Address  Phone  Notes  [facility] Department of [facility] [address] [place], [place] [phone] Accepts children up to age 8 who are enrolled in [place] or [place] Health Choice; pregnant  women with a Medicaid card; and children who have applied for Medicaid or [place] Health Choice, but were declined, whose parents can pay a reduced fee at time of service.  [facility]  [address], [place] [phone] Accepts children up to age 6 who are enrolled in [place] or [place] Health Choice; pregnant women with  a Medicaid card; and children who have applied for Medicaid or [place] Health Choice, but were declined, whose parents can pay a reduced fee at time of service.  [facility]  [address] [place], [place] [phone] Patients are seen by appointment only. Walk-ins are not accepted. [facility] will see patients 63 years of age and older. Monday - Tuesday (8am-5pm) Most Wednesdays (8:30-5pm) $30 per visit, cash only  [facility] Adult Dental Access PROGRAM  [address], [facility] [phone] Patients are seen by appointment only. Walk-ins are not accepted. [facility] will see patients 88 years of age and older. One Wednesday Evening (Monthly: Volunteer Based).  $30 per visit, cash only  [employer]  [phone] for adults; Children under age 45, call Graduate Pediatric Dentistry at [phone]. Children aged 65-14, please call [phone] to request a pediatric application.  Dental services are provided in all areas of dental care including fillings, crowns and bridges, complete and partial dentures, implants, gum treatment, root canals, and extractions. Preventive care is also provided. Treatment is provided to both adults and children. Patients are selected via a lottery and there is often a waiting list.   [facility] [address] [place]  [phone] [email or web address]   [facility] [address] [place], [place] [phone], Ext. [phone] Second and Fourth Thursday of each month, opens at 6:30 AM; Clinic ends at 9 AM.  Patients are  seen on a first-come first-served basis, and a limited number are seen during each clinic.   [facility]  [address] Ether Griffins [place], [place] [phone]   Eligibility Requirements You must have lived in [place], [place], or [place] counties for at least the last three months.   You cannot be eligible for state or federal sponsored [place], including [employer], [place], or [employer].   You generally cannot be eligible for healthcare insurance through your employer.    How to apply: Eligibility screenings are held every Tuesday and Wednesday afternoon from 1:00 pm until 4:00 pm. You do not need an appointment for the interview!  [facility] [address] [place], [place] [phone]   [facility] Health Department  [phone]   [facility] Health Department  [phone]   [facility]  [phone]    Behavioral Health Resources in the Community: Intensive Outpatient Programs Organization         Address  Phone  Notes  [facility] [address], [place], [place] [phone]   [facility] [address] [place], [place] [phone]   [facility] [address] [place], [place]  [phone]   [facility] Mental Health [address]  [place], [place] [phone] or [phone]   Substance Abuse Resources Organization         Address  Phone  Notes  [facility]  [phone]   [facility]  [phone]   [facility]  [phone]   Floydene Flock  [phone]   Residential & Outpatient Substance Abuse Program  [phone]   Psychological Services Organization         Address  Phone  Notes  [facility] Behavioral Health  [phone]   [facility]  [phone]   [facility] Mental Health [address] [place] [phone] or [phone]    Mobile Crisis  Teams Organization  Address  Phone  Notes  [facility], Mobile Crisis Care Unit  [phone]   [facility]  [address] [place], [place] [phone]   [facility] [address] [place] [phone]    Self-Help/Support Groups Organization         Address  Phone             Notes  [facility] [place] - variety of support groups  336- I7437963 Call for more information  Narcotics Anonymous (NA), [facility] [address], [employer] [place]  2 meetings at this location   Statistician         Address  Phone  Notes  [facility] [address],    [place]  [phone]   [facility]  [address], [place] [address], [place], [place] [phone]   [facility] [address] [place], [place] [phone] Admissions: 8am-3pm M-F  [facility] [address],    [place], [place] [phone]   [facility] [address] [place], [place], [place] [phone]   [facility] [address]  [place], [place] [phone]   [facility] - Intensive Outpatient [address] Laurell Joseph[address], [place], [place] [phone]   [facility] ([facility] [address] [place]  [place], [place] [phone] or [phone]   [facility] ([facility]) [address], [place], [place] [phone] Accepts Medicaid  [facility] [address],  [place] [phone] Substance Abuse/Addiction Treatment   [facility] Organization         Address  Phone  Notes  [facility]  [phone]   Angie Fava, PhD [address] Ervin Knack [place], [place]   [phone] or [phone]   [facility] Behavioral   [address] [place], [place] [phone]   [facility] [address] [place], [place] [phone]  Insurance/Medicaid/sponsorship through [facility] and Families [address]                                    [place], [place] [phone] Therapy/tele-psych/case  [facility] [address], [place] [phone]    Dr. Lolly Mustache  [phone]   [facility]  [employer] [facility] Dept. 1) [address], [place] 2) [address] [place] 3)  [address], [place] [phone]  [phone]   [facility] Child Abuse Hotline (541)182-5922 or [phone] (After Hours)

## 2015-03-03 NOTE — ED Notes (Signed)
Presents with muscle spasms from the neck down to her back began this evening after laughing, pain is severe-reports she has spasms and has them before but not as severe. Pt is tearful

## 2015-03-03 NOTE — ED Provider Notes (Signed)
CSN: [id number]     Arrival date & time 03/03/15  1530 History   First MD Initiated Contact with Patient 03/03/15 1555     Chief Complaint  Patient presents with  . Spasms     (Consider location/radiation/quality/duration/timing/severity/associated sxs/prior Treatment) HPI  Marissa Benton is a 39 y.o. female presenting with muscle spasms to bilateral neck that radiate down her back at started today after laughing fit. Patient reports pain is severe sharp and shooting. Patient states she's had muscle spasms similar to this before but not as severe. She denies any numbness, tingling, weakness. No headache, nausea, vomiting, diarrhea. Patient is not taken anything for her symptoms.    Past Medical History  Diagnosis Date  . Asthma   . Stroke    Past Surgical History  Procedure Laterality Date  . Dilation and curettage of uterus     History reviewed. No pertinent family history. History  Substance Use Topics  . Smoking status: Never Smoker   . Smokeless tobacco: Never Used  . Alcohol Use: Yes     Comment: occasion   OB History    No data available     Review of Systems 10 Systems reviewed and are negative for acute change except as noted in the HPI.    Allergies  Benadryl  Home Medications   Prior to Admission medications   Medication Sig Start Date End Date Taking? Authorizing Provider  acetaminophen (TYLENOL) 650 MG CR tablet Take 1,300 mg by mouth daily as needed for pain.    Historical Provider, MD  diazepam (VALIUM) 5 MG tablet Take 1 tablet (5 mg total) by mouth 2 (two) times daily. 03/03/15   Oswaldo ConroyVictoria Venora Kautzman, PA-C  diphenhydrAMINE (BENADRYL) 25 MG tablet Take 2 tablets (50 mg total) by mouth every 4 (four) hours as needed for itching. 12/10/14   Joycie PeekBenjamin Cartner, PA-C  doxycycline (VIBRAMYCIN) 100 MG capsule Take 1 capsule (100 mg total) by mouth 2 (two) times daily. 12/17/14   Marlon Peliffany Greene, PA-C  hydrocortisone cream 1 % Apply to affected area 2 times daily 12/10/14    Joycie PeekBenjamin Cartner, PA-C  hydrOXYzine (ATARAX/VISTARIL) 25 MG tablet Take 1 tablet (25 mg total) by mouth every 6 (six) hours. 12/17/14   Tiffany Neva SeatGreene, PA-C  ibuprofen (ADVIL,MOTRIN) 400 MG tablet Take 1 tablet (400 mg total) by mouth every 6 (six) hours as needed. 03/03/15   Oswaldo ConroyVictoria Noralee Dutko, PA-C  naproxen (NAPROSYN) 375 MG tablet Take 1 tablet (375 mg total) by mouth 2 (two) times daily as needed. 11/07/14   Marisa Severinlga Otter, MD  oxyCODONE-acetaminophen (PERCOCET/ROXICET) 5-325 MG per tablet Take 1-2 tablets by mouth every 4 (four) hours as needed for severe pain. 11/07/14   Marisa Severinlga Otter, MD  penicillin v potassium (VEETID) 500 MG tablet Take 1 tablet (500 mg total) by mouth 4 (four) times daily. 11/07/14   Marisa Severinlga Otter, MD  predniSONE (DELTASONE) 10 MG tablet Take 4 tablets (40 mg total) by mouth daily. 12/18/14   Tiffany Neva SeatGreene, PA-C  ranitidine (ZANTAC) 150 MG tablet Take 1 tablet (150 mg total) by mouth 2 (two) times daily. 12/10/14   Joycie PeekBenjamin Cartner, PA-C   BP 115/84 mmHg  Pulse 86  Temp(Src) 98.5 F (36.9 C) (Oral)  Resp 18  SpO2 98%  LMP 02/26/2015 (Approximate) Physical Exam  Constitutional: She appears well-developed and well-nourished. No distress.  HENT:  Head: Normocephalic and atraumatic.  Mouth/Throat: Oropharynx is clear and moist.  Eyes: Conjunctivae and EOM are normal. Pupils are equal, round, and reactive to light.  Right eye exhibits no discharge. Left eye exhibits no discharge.  Neck: Normal range of motion. Neck supple.  No nuchal rigidity  Cardiovascular: Normal rate and regular rhythm.   Pulmonary/Chest: Effort normal and breath sounds normal. No respiratory distress. She has no wheezes.  Abdominal: Soft. Bowel sounds are normal. She exhibits no distension. There is no tenderness.  Musculoskeletal:  No significant spine tenderness. Muscle tightness to c-spine paraspinal muscles.   Neurological: She is alert. No cranial nerve deficit. Coordination normal.  Speech is clear and goal  oriented.  Strength 5/5 in upper and lower extremities. Sensation intact. Intact rapid alternating movements, finger to nose, and heel to shin. Negative Romberg. No pronator drift. DTRs equal and intact. Normal gait.   Skin: Skin is warm and dry. She is not diaphoretic.  Nursing note and vitals reviewed.   ED Course  Procedures (including critical care time) Labs Review Labs Reviewed  BASIC METABOLIC PANEL - Abnormal; Notable for the following:    Glucose, Bld 125 (*)    All other components within normal limits  CBC WITH DIFFERENTIAL/PLATELET    Imaging Review No results found.   EKG Interpretation None      MDM   Final diagnoses:  Muscle spasms of neck  Muscle spasm   Pt presenting with muscle spasms for one day. VSS. Normal neuro exam. Patient with improvement in the ED. Lab workup unremarkable. Discussed ibuprofen and symptomatic treatment. Patient discharged with prescription for Valium. Driving and sedation precautions provided. Patient to follow-up with PCP. Patient states is looking for PCP. Referral to the [facility] and ED resources provided as well. Pt nontoxic and well-appearing and stable for outpatient management and discharge.  Discussed return precautions with patient. Discussed all results and patient verbalizes understanding and agrees with plan.     Oswaldo Conroy, PA-C 03/03/15 2012  Rolland Porter, MD 03/07/15 [phone]

## 2015-09-14 ENCOUNTER — Emergency Department (HOSPITAL_COMMUNITY): Payer: 59

## 2015-09-14 ENCOUNTER — Emergency Department (HOSPITAL_COMMUNITY)
Admission: EM | Admit: 2015-09-14 | Discharge: 2015-09-14 | Disposition: A | Discharge status: Home / Self Care | Payer: 59 | Attending: Emergency Medicine | Admitting: Emergency Medicine

## 2015-09-14 ENCOUNTER — Encounter (HOSPITAL_COMMUNITY): Payer: Self-pay | Admitting: *Deleted

## 2015-09-14 DIAGNOSIS — R1011 Right upper quadrant pain: Secondary | ICD-10-CM | POA: Diagnosis present

## 2015-09-14 DIAGNOSIS — Z8673 Personal history of transient ischemic attack (TIA), and cerebral infarction without residual deficits: Secondary | ICD-10-CM | POA: Diagnosis not present

## 2015-09-14 DIAGNOSIS — R112 Nausea with vomiting, unspecified: Secondary | ICD-10-CM | POA: Insufficient documentation

## 2015-09-14 DIAGNOSIS — Z79899 Other long term (current) drug therapy: Secondary | ICD-10-CM | POA: Insufficient documentation

## 2015-09-14 DIAGNOSIS — J45909 Unspecified asthma, uncomplicated: Secondary | ICD-10-CM | POA: Insufficient documentation

## 2015-09-14 LAB — URINALYSIS, ROUTINE W REFLEX MICROSCOPIC
BILIRUBIN URINE: NEGATIVE
Glucose, UA: NEGATIVE mg/dL
HGB URINE DIPSTICK: NEGATIVE
Ketones, ur: 15 mg/dL — AB
Leukocytes, UA: NEGATIVE
Nitrite: NEGATIVE
PH: 5 (ref 5.0–8.0)
Protein, ur: NEGATIVE mg/dL
SPECIFIC GRAVITY, URINE: 1.023 (ref 1.005–1.030)

## 2015-09-14 LAB — CBC
HEMATOCRIT: 41.7 % (ref 36.0–46.0)
HEMOGLOBIN: 13.7 g/dL (ref 12.0–15.0)
MCH: 32.5 pg (ref 26.0–34.0)
MCHC: 32.9 g/dL (ref 30.0–36.0)
MCV: 99 fL (ref 78.0–100.0)
Platelets: 243 10*3/uL (ref 150–400)
RBC: 4.21 MIL/uL (ref 3.87–5.11)
RDW: 12.6 % (ref 11.5–15.5)
WBC: 10.6 10*3/uL — ABNORMAL HIGH (ref 4.0–10.5)

## 2015-09-14 LAB — COMPREHENSIVE METABOLIC PANEL
ALBUMIN: 4.2 g/dL (ref 3.5–5.0)
ALK PHOS: 76 U/L (ref 38–126)
ALT: 29 U/L (ref 14–54)
ANION GAP: 11 (ref 5–15)
AST: 23 U/L (ref 15–41)
BUN: 7 mg/dL (ref 6–20)
CO2: 23 mmol/L (ref 22–32)
Calcium: 9.3 mg/dL (ref 8.9–10.3)
Chloride: 106 mmol/L (ref 101–111)
Creatinine, Ser: 0.69 mg/dL (ref 0.44–1.00)
GFR calc Af Amer: >60 mL/min (ref >60)
GFR calc non Af Amer: >60 mL/min (ref >60)
GLUCOSE: 91 mg/dL (ref 65–99)
POTASSIUM: 4.1 mmol/L (ref 3.5–5.1)
SODIUM: 140 mmol/L (ref 135–145)
Total Bilirubin: 0.7 mg/dL (ref 0.3–1.2)
Total Protein: 7.5 g/dL (ref 6.5–8.1)

## 2015-09-14 LAB — I-STAT BETA HCG BLOOD, ED (MC, WL, AP ONLY): I-stat hCG, quantitative: <5 m[IU]/mL (ref <5)

## 2015-09-14 LAB — LIPASE, BLOOD: Lipase: 55 U/L — ABNORMAL HIGH (ref 11–51)

## 2015-09-14 MED ORDER — CALCIUM & MAGNESIUM CARBONATES 311-232 MG PO TABS: 1.0000 | ORAL_TABLET | Freq: Three times a day (TID) | ORAL | Status: DC

## 2015-09-14 MED ORDER — ONDANSETRON HCL 4 MG/2ML IJ SOLN
4.0000 mg | Freq: Once | INTRAMUSCULAR | Status: AC
  Administered 2015-09-14: 4 mg via INTRAVENOUS
  Filled 2015-09-14: qty 2

## 2015-09-14 MED ORDER — IOHEXOL 300 MG/ML  SOLN: 25.0000 mL | Freq: Once | INTRAMUSCULAR | Status: DC | PRN

## 2015-09-14 MED ORDER — OMEPRAZOLE 20 MG PO CPDR: 20.0000 mg | DELAYED_RELEASE_CAPSULE | Freq: Every day | ORAL | Status: DC

## 2015-09-14 MED ORDER — PROMETHAZINE HCL 25 MG PO TABS: 25.0000 mg | ORAL_TABLET | Freq: Four times a day (QID) | ORAL | Status: DC | PRN

## 2015-09-14 MED ORDER — HYDROCODONE-ACETAMINOPHEN 5-325 MG PO TABS: 1.0000 | ORAL_TABLET | ORAL | Status: DC | PRN

## 2015-09-14 MED ORDER — IOHEXOL 300 MG/ML  SOLN
100.0000 mL | Freq: Once | INTRAMUSCULAR | Status: AC | PRN
  Administered 2015-09-14: 100 mL via INTRAVENOUS

## 2015-09-14 MED ORDER — HYDROMORPHONE HCL 1 MG/ML IJ SOLN
1.0000 mg | INTRAMUSCULAR | Status: DC | PRN
  Administered 2015-09-14 (×2): 1 mg via INTRAVENOUS
  Filled 2015-09-14 (×2): qty 1

## 2015-09-14 MED ORDER — ONDANSETRON 4 MG PO TBDP
8.0000 mg | ORAL_TABLET | Freq: Once | ORAL | Status: AC
  Administered 2015-09-14: 8 mg via ORAL
  Filled 2015-09-14: qty 2

## 2015-09-14 NOTE — ED Provider Notes (Signed)
CSN: [id number]     Arrival date & time 09/14/15  1248 History   First MD Initiated Contact with Patient 09/14/15 1606     Chief Complaint  Patient presents with  . Abdominal Pain  . Emesis    Patient is a 40 y.o. female presenting with abdominal pain and vomiting. The history is provided by the patient.  Abdominal Pain Pain location:  RUQ Pain quality: sharp   Pain severity:  Severe Timing:  Constant Progression:  Worsening Context: eating   Relieved by:  Nothing Associated symptoms: nausea and vomiting   Associated symptoms: no constipation, no diarrhea, no dysuria, no fever and no hematochezia   Emesis Associated symptoms: abdominal pain   Associated symptoms: no diarrhea    Pt started having pain this morning after eating.  She felt like the food didn't digest .  Since this morning she has been having pain in the ruq and it moves to the back.  She has had trouble in the past with eating but not this severe. Past Medical History  Diagnosis Date  . Asthma   . Stroke [facility] 2)    Past Surgical History  Procedure Laterality Date  . Dilation and curettage of uterus     No family history on file. Social History  Substance Use Topics  . Smoking status: Never Smoker   . Smokeless tobacco: Never Used  . Alcohol Use: Yes     Comment: occasion   OB History    No data available     Review of Systems  Constitutional: Negative for fever.  Gastrointestinal: Positive for nausea, vomiting and abdominal pain. Negative for diarrhea, constipation and hematochezia.  Genitourinary: Negative for dysuria.  All other systems reviewed and are negative.     Allergies  Benadryl  Home Medications   Prior to Admission medications   Medication Sig Start Date End Date Taking? Authorizing Provider  Aspirin-Salicylamide-Caffeine (ARTHRITIS STRENGTH BC POWDER PO) Take 1,000 mg by mouth daily as needed (for arthritis pain).   Yes Historical Provider, MD  ibuprofen (ADVIL,MOTRIN) 200 MG  tablet Take 400 mg by mouth daily as needed for moderate pain.   Yes Historical Provider, MD  calcium & magnesium carbonates (MYLANTA) 311-232 MG tablet Take 1 tablet by mouth 3 (three) times daily with meals. 09/14/15   Linwood Dibbles, MD  diazepam (VALIUM) 5 MG tablet Take 1 tablet (5 mg total) by mouth 2 (two) times daily. Patient not taking: Reported on 09/14/2015 03/03/15   Oswaldo Conroy, PA-C  diphenhydrAMINE (BENADRYL) 25 MG tablet Take 2 tablets (50 mg total) by mouth every 4 (four) hours as needed for itching. Patient not taking: Reported on 09/14/2015 12/10/14   Joycie Peek, PA-C  doxycycline (VIBRAMYCIN) 100 MG capsule Take 1 capsule (100 mg total) by mouth 2 (two) times daily. Patient not taking: Reported on 09/14/2015 12/17/14   Marlon Pel, PA-C  HYDROcodone-acetaminophen (NORCO/VICODIN) 5-325 MG tablet Take 1 tablet by mouth every 4 (four) hours as needed. 09/14/15   Linwood Dibbles, MD  hydrocortisone cream 1 % Apply to affected area 2 times daily Patient not taking: Reported on 09/14/2015 12/10/14   Joycie Peek, PA-C  hydrOXYzine (ATARAX/VISTARIL) 25 MG tablet Take 1 tablet (25 mg total) by mouth every 6 (six) hours. Patient not taking: Reported on 09/14/2015 12/17/14   Marlon Pel, PA-C  ibuprofen (ADVIL,MOTRIN) 400 MG tablet Take 1 tablet (400 mg total) by mouth every 6 (six) hours as needed. Patient not taking: Reported on 09/14/2015 03/03/15   [place]  Creech, PA-C  naproxen (NAPROSYN) 375 MG tablet Take 1 tablet (375 mg total) by mouth 2 (two) times daily as needed. Patient not taking: Reported on 09/14/2015 11/07/14   Marisa Severin, MD  omeprazole (PRILOSEC) 20 MG capsule Take 1 capsule (20 mg total) by mouth daily. 09/14/15   Linwood Dibbles, MD  oxyCODONE-acetaminophen (PERCOCET/ROXICET) 5-325 MG per tablet Take 1-2 tablets by mouth every 4 (four) hours as needed for severe pain. Patient not taking: Reported on 09/14/2015 11/07/14   Marisa Severin, MD  penicillin v potassium (VEETID) 500 MG tablet  Take 1 tablet (500 mg total) by mouth 4 (four) times daily. Patient not taking: Reported on 09/14/2015 11/07/14   Marisa Severin, MD  predniSONE (DELTASONE) 10 MG tablet Take 4 tablets (40 mg total) by mouth daily. Patient not taking: Reported on 09/14/2015 12/18/14   Marlon Pel, PA-C  promethazine (PHENERGAN) 25 MG tablet Take 1 tablet (25 mg total) by mouth every 6 (six) hours as needed for nausea or vomiting. 09/14/15   Linwood Dibbles, MD  ranitidine (ZANTAC) 150 MG tablet Take 1 tablet (150 mg total) by mouth 2 (two) times daily. Patient not taking: Reported on 09/14/2015 12/10/14   Joycie Peek, PA-C   BP 103/92 mmHg  Pulse 82  Temp(Src) 98.2 F (36.8 C) (Oral)  Resp 18  Ht 5\' 2"  (1.575 m)  Wt 86.183 kg  BMI 34.74 kg/m2  SpO2 99%  LMP 08/14/2015 Physical Exam  Constitutional: She appears well-developed and well-nourished. She appears distressed.  HENT:  Head: Normocephalic and atraumatic.  Right Ear: External ear normal.  Left Ear: External ear normal.  Eyes: Conjunctivae are normal. Right eye exhibits no discharge. Left eye exhibits no discharge. No scleral icterus.  Neck: Neck supple. No tracheal deviation present.  Cardiovascular: Normal rate, regular rhythm and intact distal pulses.   Pulmonary/Chest: Effort normal and breath sounds normal. No stridor. No respiratory distress. She has no wheezes. She has no rales.  Abdominal: Soft. Bowel sounds are normal. She exhibits no distension. There is tenderness in the right upper quadrant. There is guarding. There is no rigidity, no rebound and no CVA tenderness. No hernia.  Musculoskeletal: She exhibits no edema or tenderness.  Neurological: She is alert. She has normal strength. No cranial nerve deficit (no facial droop, extraocular movements intact, no slurred speech) or sensory deficit. She exhibits normal muscle tone. She displays no seizure activity. Coordination normal.  Skin: Skin is warm and dry. No rash noted.  No rash noted on the  back  Psychiatric: She has a normal mood and affect.  Nursing note and vitals reviewed.   ED Course  Procedures (including critical care time) Labs Review Labs Reviewed  LIPASE, BLOOD - Abnormal; Notable for the following:    Lipase 55 (*)    All other components within normal limits  CBC - Abnormal; Notable for the following:    WBC 10.6 (*)    All other components within normal limits  URINALYSIS, ROUTINE W REFLEX MICROSCOPIC (NOT AT [facility] - Abnormal; Notable for the following:    APPearance CLOUDY (*)    Ketones, ur 15 (*)    All other components within normal limits  COMPREHENSIVE METABOLIC PANEL  I-STAT BETA HCG BLOOD, ED ([facility], [facility], [facility] ONLY)    Imaging Review US Abdomen Complete  09/14/2015  CLINICAL DATA:  Right upper quadrant pain. EXAM: ABDOMEN ULTRASOUND COMPLETE COMPARISON:  None. FINDINGS: Gallbladder: No gallstones or wall thickening visualized. No sonographic Murphy sign noted by sonographer. Common  bile duct: Diameter: 2.9 mm Liver: No focal lesion identified. Increased hepatic parenchymal echogenicity. IVC: No abnormality visualized. Pancreas: Limited visualization secondary to overlying bowel gas. Spleen: Size and appearance within normal limits. Right Kidney: Length: 10.2 cm. Echogenicity within normal limits. No mass or hydronephrosis visualized. Left Kidney: Length: 10.3 cm. Echogenicity within normal limits. No mass or hydronephrosis visualized. Abdominal aorta: No aneurysm visualized. Other findings: None. IMPRESSION: 1. No cholelithiasis or sonographic evidence of acute cholecystitis. 2. Increased hepatic echogenicity as can be seen with hepatic steatosis. Electronically Signed   By: Elige KoHetal  Patel   On: 09/14/2015 18:29   Ct Abdomen Pelvis W Contrast  09/14/2015  CLINICAL DATA:  RIGHT-sided abdominal pain, vomiting with eating. EXAM: CT ABDOMEN AND PELVIS WITH CONTRAST TECHNIQUE: Multidetector CT imaging of the abdomen and pelvis was performed using the standard  protocol following bolus administration of intravenous contrast. CONTRAST:  100mL OMNIPAQUE IOHEXOL 300 MG/ML  SOLN COMPARISON:  Ultrasound 09/14/2015 FINDINGS: Lower chest: Lung bases are clear. Hepatobiliary: No focal hepatic lesion. No biliary duct dilatation. Gallbladder is normal. Common bile duct is normal. Pancreas: Pancreas is normal. No ductal dilatation. No pancreatic inflammation. Spleen: Normal spleen Adrenals/urinary tract: Adrenal glands and kidneys are normal. The ureters and bladder normal. Stomach/Bowel: Stomach, small bowel, appendix, and cecum are normal. There are multiple diverticula of the transverse colon and descending colon. Diverticula of the sigmoid colon to lesser degree. No acute inflammation along the course of the LEFT colon. Vascular/Lymphatic: Abdominal aorta is normal caliber. There is no retroperitoneal or periportal lymphadenopathy. No pelvic lymphadenopathy. Reproductive: The endometrium is expanded with low attenuation material suggesting menses. There is a cysts within the LEFT ovary measuring 29 mm likely represents a corpus luteal cyst. RIGHT ovary normal. Other: No free fluid. Musculoskeletal: No aggressive osseous lesion. IMPRESSION: 1. Normal appendix. 2. Moderate to severe diverticulosis of the LEFT colon without acute inflammation to suggest acute diverticulitis. 3. Findings in the uterus and ovaries relates to menses. Electronically Signed   By: Genevive BiStewart  Edmunds M.D.   On: 09/14/2015 19:41   I have personally reviewed and evaluated these images and lab results as part of my medical decision-making.    MDM   Final diagnoses:  RUQ pain    Patient's symptoms were initially concerning for acute cholecystitis. She had right upper quadrant pain radiating to her back. Abdominal ultrasound did not show any acute abnormalities. Patient's laboratory tests were unremarkable with exception of elevated white blood cell count is very mild increase in the lipase but I do  not think these are significant.  Does report persistent pain a CT scan of the abdomen and pelvis was performed. There are new acute abnormalities to account for her pain.  Patient's symptoms improved as she has been in the emergency room but have not completely resolved. At this point there does not appear to be evidence of any acute surgical or emergency condition. I doubt appendicitis or cholecystitis. She has no lower abdominal pain suggest ovarian or uterine pathology.  Plan on discharge home with antacids and symptomatic medications. Follow-up with a gastroenterologist for further evaluation.    Linwood DibblesJon Keyoni Lapinski, MD 09/14/15 2028

## 2015-09-14 NOTE — ED Notes (Signed)
Pt c/o R upper abd pain onset today post eating, pt reports x 1 episode of vomiting, denies diarrhea, hx of similar symptoms, c/o R upper quadrant tightness & cramping, pt states, "It looks like my stomach is swelling." pt A&O x4

## 2015-09-14 NOTE — Discharge Instructions (Signed)

## 2015-12-11 ENCOUNTER — Emergency Department (HOSPITAL_COMMUNITY)
Admission: EM | Admit: 2015-12-11 | Discharge: 2015-12-11 | Disposition: A | Discharge status: Home / Self Care | Payer: 59 | Attending: Emergency Medicine | Admitting: Emergency Medicine

## 2015-12-11 ENCOUNTER — Encounter (HOSPITAL_COMMUNITY): Payer: Self-pay | Admitting: Emergency Medicine

## 2015-12-11 DIAGNOSIS — Z8673 Personal history of transient ischemic attack (TIA), and cerebral infarction without residual deficits: Secondary | ICD-10-CM | POA: Insufficient documentation

## 2015-12-11 DIAGNOSIS — J45909 Unspecified asthma, uncomplicated: Secondary | ICD-10-CM | POA: Insufficient documentation

## 2015-12-11 DIAGNOSIS — M549 Dorsalgia, unspecified: Secondary | ICD-10-CM | POA: Diagnosis present

## 2015-12-11 DIAGNOSIS — Z87891 Personal history of nicotine dependence: Secondary | ICD-10-CM | POA: Insufficient documentation

## 2015-12-11 DIAGNOSIS — Z79899 Other long term (current) drug therapy: Secondary | ICD-10-CM | POA: Insufficient documentation

## 2015-12-11 DIAGNOSIS — M5489 Other dorsalgia: Secondary | ICD-10-CM | POA: Diagnosis not present

## 2015-12-11 LAB — URINALYSIS, ROUTINE W REFLEX MICROSCOPIC
Bilirubin Urine: NEGATIVE
GLUCOSE, UA: NEGATIVE mg/dL
Hgb urine dipstick: NEGATIVE
KETONES UR: NEGATIVE mg/dL
Nitrite: NEGATIVE
PH: 6.5 (ref 5.0–8.0)
Protein, ur: NEGATIVE mg/dL
Specific Gravity, Urine: 1.01 (ref 1.005–1.030)

## 2015-12-11 LAB — URINE MICROSCOPIC-ADD ON

## 2015-12-11 LAB — CBC
HCT: 39.5 % (ref 36.0–46.0)
HEMOGLOBIN: 12.8 g/dL (ref 12.0–15.0)
MCH: 31.7 pg (ref 26.0–34.0)
MCHC: 32.4 g/dL (ref 30.0–36.0)
MCV: 97.8 fL (ref 78.0–100.0)
Platelets: 252 10*3/uL (ref 150–400)
RBC: 4.04 MIL/uL (ref 3.87–5.11)
RDW: 12.4 % (ref 11.5–15.5)
WBC: 6.8 10*3/uL (ref 4.0–10.5)

## 2015-12-11 LAB — BASIC METABOLIC PANEL
Anion gap: 9 (ref 5–15)
BUN: 7 mg/dL (ref 6–20)
CHLORIDE: 104 mmol/L (ref 101–111)
CO2: 26 mmol/L (ref 22–32)
CREATININE: 0.72 mg/dL (ref 0.44–1.00)
Calcium: 9.4 mg/dL (ref 8.9–10.3)
GFR calc Af Amer: >60 mL/min (ref >60)
GFR calc non Af Amer: >60 mL/min (ref >60)
GLUCOSE: 120 mg/dL — AB (ref 65–99)
Potassium: 4.4 mmol/L (ref 3.5–5.1)
SODIUM: 139 mmol/L (ref 135–145)

## 2015-12-11 MED ORDER — MORPHINE SULFATE (PF) 4 MG/ML IV SOLN
4.0000 mg | Freq: Once | INTRAVENOUS | Status: AC
  Administered 2015-12-11: 4 mg via INTRAVENOUS
  Filled 2015-12-11: qty 1

## 2015-12-11 MED ORDER — ONDANSETRON HCL 4 MG/2ML IJ SOLN
4.0000 mg | Freq: Once | INTRAMUSCULAR | Status: AC
  Administered 2015-12-11: 4 mg via INTRAVENOUS
  Filled 2015-12-11: qty 2

## 2015-12-11 MED ORDER — DIAZEPAM 5 MG PO TABS
5.0000 mg | ORAL_TABLET | Freq: Once | ORAL | Status: AC
  Administered 2015-12-11: 5 mg via ORAL
  Filled 2015-12-11: qty 1

## 2015-12-11 MED ORDER — KETOROLAC TROMETHAMINE 60 MG/2ML IM SOLN
60.0000 mg | Freq: Once | INTRAMUSCULAR | Status: AC
  Administered 2015-12-11: 60 mg via INTRAMUSCULAR
  Filled 2015-12-11: qty 2

## 2015-12-11 MED ORDER — LIDOCAINE-EPINEPHRINE (PF) 2 %-1:200000 IJ SOLN
10.0000 mL | Freq: Once | INTRAMUSCULAR | Status: AC
  Administered 2015-12-11: 10 mL
  Filled 2015-12-11: qty 20

## 2015-12-11 MED ORDER — HYDROCODONE-ACETAMINOPHEN 5-325 MG PO TABS: 1.0000 | ORAL_TABLET | Freq: Four times a day (QID) | ORAL | Status: DC | PRN

## 2015-12-11 MED ORDER — DIAZEPAM 5 MG PO TABS: 5.0000 mg | ORAL_TABLET | Freq: Two times a day (BID) | ORAL | Status: DC

## 2015-12-11 MED ORDER — IBUPROFEN 600 MG PO TABS: 600.0000 mg | ORAL_TABLET | Freq: Four times a day (QID) | ORAL | Status: DC | PRN

## 2015-12-11 NOTE — ED Notes (Signed)
Per pt, she has had back pain x 2 days. This am she felt a "knot" on the right side of her back. Also c/o headache.

## 2015-12-11 NOTE — Discharge Instructions (Signed)
[name],  Nice meeting you! Please follow-up with your primary care provider. Return to the emergency department if you lose control of your bladder, have fevers, chills, increased pain, shortness of breath, chest pain. Feel better soon!  S. Marissa HackerNicole Michio Thier, PA-C Back Pain, Adult Back pain is very common in adults.The cause of back pain is rarely dangerous and the pain often gets better over time.The cause of your back pain may not be known. Some common causes of back pain include:  Strain of the muscles or ligaments supporting the spine.  Wear and tear (degeneration) of the spinal disks.  Arthritis.  Direct injury to the back. For many people, back pain may return. Since back pain is rarely dangerous, most people can learn to manage this condition on their own. HOME CARE INSTRUCTIONS Watch your back pain for any changes. The following actions may help to lessen any discomfort you are feeling:  Remain active. It is stressful on your back to sit or stand in one place for long periods of time. Do not sit, drive, or stand in one place for more than 30 minutes at a time. Take short walks on even surfaces as soon as you are able.Try to increase the length of time you walk each day.  Exercise regularly as directed by your health care provider. Exercise helps your back heal faster. It also helps avoid future injury by keeping your muscles strong and flexible.  Do not stay in bed.Resting more than 1-2 days can delay your recovery.  Pay attention to your body when you bend and lift. The most comfortable positions are those that put less stress on your recovering back. Always use proper lifting techniques, including:  Bending your knees.  Keeping the load close to your body.  Avoiding twisting.  Find a comfortable position to sleep. Use a firm mattress and lie on your side with your knees slightly bent. If you lie on your back, put a pillow under your knees.  Avoid feeling  anxious or stressed.Stress increases muscle tension and can worsen back pain.It is important to recognize when you are anxious or stressed and learn ways to manage it, such as with exercise.  Take medicines only as directed by your health care provider. Over-the-counter medicines to reduce pain and inflammation are often the most helpful.Your health care provider may prescribe muscle relaxant drugs.These medicines help dull your pain so you can more quickly return to your normal activities and healthy exercise.  Apply ice to the injured area:  Put ice in a plastic bag.  Place a towel between your skin and the bag.  Leave the ice on for 20 minutes, 2-3 times a day for the first 2-3 days. After that, ice and heat may be alternated to reduce pain and spasms.  Maintain a healthy weight. Excess weight puts extra stress on your back and makes it difficult to maintain good posture. SEEK MEDICAL CARE IF:  You have pain that is not relieved with rest or medicine.  You have increasing pain going down into the legs or buttocks.  You have pain that does not improve in one week.  You have night pain.  You lose weight.  You have a fever or chills. SEEK IMMEDIATE MEDICAL CARE IF:   You develop new bowel or bladder control problems.  You have unusual weakness or numbness in your arms or legs.  You develop nausea or vomiting.  You develop abdominal pain.  You feel faint.   This information  is not intended to replace advice given to you by your health care provider. Make sure you discuss any questions you have with your health care provider.   Document Released: 08/17/2005 Document Revised: 09/07/2014 Document Reviewed: 12/19/2013 Elsevier Interactive Patient Education [employer].

## 2015-12-11 NOTE — ED Provider Notes (Signed)
CSN: [id number]     Arrival date & time 12/11/15  [id number] History   First MD Initiated Contact with Patient 12/11/15 [phone]     Chief Complaint  Patient presents with  . Back Pain   HPI   Julienne Kassenekia Sayed is a 40 y.o. female PMH significant for stroke presenting with a 2 day history of right-sided back pain. She describes the pain as 7 out of 10, achy, radiating into her neck as well as her head, constant, worse with palpation. She denies fevers, chills, history of this previously, abdominal pain, chest pain, SOB, loss of bladder/bowel control, nausea, vomiting, changes in bowel or bladder habits, recent injections or IV drug use, leg swelling/discolorations, recent surgeries.  Past Medical History  Diagnosis Date  . Asthma   . Stroke [facility](H[facility])    Past Surgical History  Procedure Laterality Date  . Dilation and curettage of uterus     No family history on file. Social History  Substance Use Topics  . Smoking status: Former Games developermoker  . Smokeless tobacco: Never Used  . Alcohol Use: Yes     Comment: occasion   OB History    No data available     Review of Systems  Ten systems are reviewed and are negative for acute change except as noted in the HPI  Allergies  Benadryl  Home Medications   Prior to Admission medications   Medication Sig Start Date End Date Taking? Authorizing Provider  acetaminophen (TYLENOL) 650 MG CR tablet Take 650 mg by mouth every 8 (eight) hours as needed for pain.   Yes Historical Provider, MD  calcium & magnesium carbonates (MYLANTA) 311-232 MG tablet Take 1 tablet by mouth 3 (three) times daily with meals. Patient not taking: Reported on 12/11/2015 09/14/15   Linwood DibblesJon Knapp, MD  diazepam (VALIUM) 5 MG tablet Take 1 tablet (5 mg total) by mouth 2 (two) times daily. Patient not taking: Reported on 09/14/2015 03/03/15   Oswaldo ConroyVictoria Creech, PA-C  diphenhydrAMINE (BENADRYL) 25 MG tablet Take 2 tablets (50 mg total) by mouth every 4 (four) hours as needed for itching. Patient  not taking: Reported on 09/14/2015 12/10/14   Joycie PeekBenjamin Cartner, PA-C  doxycycline (VIBRAMYCIN) 100 MG capsule Take 1 capsule (100 mg total) by mouth 2 (two) times daily. Patient not taking: Reported on 09/14/2015 12/17/14   Marlon Peliffany Greene, PA-C  HYDROcodone-acetaminophen (NORCO/VICODIN) 5-325 MG tablet Take 1 tablet by mouth every 4 (four) hours as needed. Patient not taking: Reported on 12/11/2015 09/14/15   Linwood DibblesJon Knapp, MD  hydrocortisone cream 1 % Apply to affected area 2 times daily Patient not taking: Reported on 09/14/2015 12/10/14   Joycie PeekBenjamin Cartner, PA-C  hydrOXYzine (ATARAX/VISTARIL) 25 MG tablet Take 1 tablet (25 mg total) by mouth every 6 (six) hours. Patient not taking: Reported on 09/14/2015 12/17/14   Marlon Peliffany Greene, PA-C  ibuprofen (ADVIL,MOTRIN) 400 MG tablet Take 1 tablet (400 mg total) by mouth every 6 (six) hours as needed. Patient not taking: Reported on 09/14/2015 03/03/15   Oswaldo ConroyVictoria Creech, PA-C  naproxen (NAPROSYN) 375 MG tablet Take 1 tablet (375 mg total) by mouth 2 (two) times daily as needed. Patient not taking: Reported on 09/14/2015 11/07/14   Marisa Severinlga Otter, MD  omeprazole (PRILOSEC) 20 MG capsule Take 1 capsule (20 mg total) by mouth daily. Patient not taking: Reported on 12/11/2015 09/14/15   Linwood DibblesJon Knapp, MD  oxyCODONE-acetaminophen (PERCOCET/ROXICET) 5-325 MG per tablet Take 1-2 tablets by mouth every 4 (four) hours as needed for severe pain. Patient not  taking: Reported on 09/14/2015 11/07/14   Marisa Severin, MD  penicillin v potassium (VEETID) 500 MG tablet Take 1 tablet (500 mg total) by mouth 4 (four) times daily. Patient not taking: Reported on 09/14/2015 11/07/14   Marisa Severin, MD  predniSONE (DELTASONE) 10 MG tablet Take 4 tablets (40 mg total) by mouth daily. Patient not taking: Reported on 09/14/2015 12/18/14   Marlon Pel, PA-C  promethazine (PHENERGAN) 25 MG tablet Take 1 tablet (25 mg total) by mouth every 6 (six) hours as needed for nausea or vomiting. Patient not taking: Reported on  12/11/2015 09/14/15   Linwood Dibbles, MD  ranitidine (ZANTAC) 150 MG tablet Take 1 tablet (150 mg total) by mouth 2 (two) times daily. Patient not taking: Reported on 09/14/2015 12/10/14   Joycie Peek, PA-C   BP 117/85 mmHg  Pulse 72  Temp(Src) 97.9 F (36.6 C) (Oral)  Resp 18  SpO2 98%  LMP 11/22/2015 Physical Exam  Constitutional: She is oriented to person, place, and time. She appears well-developed and well-nourished. No distress.  HENT:  Head: Normocephalic and atraumatic.  Mouth/Throat: Oropharynx is clear and moist. No oropharyngeal exudate.  Eyes: Conjunctivae are normal. Pupils are equal, round, and reactive to light. Right eye exhibits no discharge. Left eye exhibits no discharge. No scleral icterus.  Neck: Normal range of motion. No tracheal deviation present.  Cardiovascular: Normal rate, regular rhythm, normal heart sounds and intact distal pulses.  Exam reveals no gallop and no friction rub.   No murmur heard. Pulmonary/Chest: Effort normal and breath sounds normal. No respiratory distress. She has no wheezes. She has no rales.   She exhibits no tenderness.  Abdominal: Soft. Bowel sounds are normal. She exhibits no distension and no mass. There is no tenderness. There is no rebound and no guarding.  Musculoskeletal: Normal range of motion. She exhibits tenderness. She exhibits no edema.  No midline cervical, thoracic, lumbar tenderness.   Lymphadenopathy:    She has no cervical adenopathy.  Neurological: She is alert and oriented to person, place, and time. Coordination normal.  Skin: Skin is warm and dry. No rash noted. She is not diaphoretic. No erythema.  Psychiatric: She has a normal mood and affect. Her behavior is normal.  Nursing note and vitals reviewed.   ED Course  Procedures  Labs Review Labs Reviewed  URINALYSIS, ROUTINE W REFLEX MICROSCOPIC (NOT AT [facility]) - Abnormal; Notable for the following:    Leukocytes, UA SMALL (*)    All other components within  normal limits  BASIC METABOLIC PANEL - Abnormal; Notable for the following:    Glucose, Bld 120 (*)    All other components within normal limits  URINE MICROSCOPIC-ADD ON - Abnormal; Notable for the following:    Squamous Epithelial / LPF 0-5 (*)    Bacteria, UA RARE (*)    All other components within normal limits  CBC   MDM   Final diagnoses:  Other back pain   Patient with back pain.  Patient nontoxic appearing, VSS. No neurological deficits and normal neuro exam. No loss of bowel or bladder control.  No concern for cauda equina.  No fever, night sweats, weight loss, h/o cancer, IVDU.  No concern for PE. RICE protocol and pain medicine indicated and discussed with patient.  Initially, patient had decreased grip strength and decreased plantar flexion but this was most likely due to pain, as this improved after pain meds.    Melton Krebs, PA-C 12/11/15 1229  Gwyneth Sprout, MD 12/11/15  2152 

## 2016-01-01 ENCOUNTER — Emergency Department (HOSPITAL_COMMUNITY): Payer: 59

## 2016-01-01 ENCOUNTER — Encounter (HOSPITAL_COMMUNITY): Payer: Self-pay | Admitting: Emergency Medicine

## 2016-01-01 ENCOUNTER — Emergency Department (HOSPITAL_COMMUNITY)
Admission: EM | Admit: 2016-01-01 | Discharge: 2016-01-01 | Disposition: A | Discharge status: Home / Self Care | Payer: 59 | Attending: Emergency Medicine | Admitting: Emergency Medicine

## 2016-01-01 DIAGNOSIS — S161XXA Strain of muscle, fascia and tendon at neck level, initial encounter: Secondary | ICD-10-CM | POA: Insufficient documentation

## 2016-01-01 DIAGNOSIS — X58XXXA Exposure to other specified factors, initial encounter: Secondary | ICD-10-CM | POA: Insufficient documentation

## 2016-01-01 DIAGNOSIS — Z87891 Personal history of nicotine dependence: Secondary | ICD-10-CM | POA: Diagnosis not present

## 2016-01-01 DIAGNOSIS — F419 Anxiety disorder, unspecified: Secondary | ICD-10-CM | POA: Diagnosis not present

## 2016-01-01 DIAGNOSIS — Z3202 Encounter for pregnancy test, result negative: Secondary | ICD-10-CM | POA: Insufficient documentation

## 2016-01-01 DIAGNOSIS — Z8673 Personal history of transient ischemic attack (TIA), and cerebral infarction without residual deficits: Secondary | ICD-10-CM | POA: Insufficient documentation

## 2016-01-01 DIAGNOSIS — S8990XA Unspecified injury of unspecified lower leg, initial encounter: Secondary | ICD-10-CM | POA: Insufficient documentation

## 2016-01-01 DIAGNOSIS — Y9289 Other specified places as the place of occurrence of the external cause: Secondary | ICD-10-CM | POA: Diagnosis not present

## 2016-01-01 DIAGNOSIS — S199XXA Unspecified injury of neck, initial encounter: Secondary | ICD-10-CM | POA: Diagnosis present

## 2016-01-01 DIAGNOSIS — M5030 Other cervical disc degeneration, unspecified cervical region: Secondary | ICD-10-CM

## 2016-01-01 DIAGNOSIS — S29002A Unspecified injury of muscle and tendon of back wall of thorax, initial encounter: Secondary | ICD-10-CM | POA: Diagnosis not present

## 2016-01-01 DIAGNOSIS — Y9389 Activity, other specified: Secondary | ICD-10-CM | POA: Diagnosis not present

## 2016-01-01 DIAGNOSIS — Y998 Other external cause status: Secondary | ICD-10-CM | POA: Diagnosis not present

## 2016-01-01 DIAGNOSIS — J45909 Unspecified asthma, uncomplicated: Secondary | ICD-10-CM | POA: Diagnosis not present

## 2016-01-01 DIAGNOSIS — S39012A Strain of muscle, fascia and tendon of lower back, initial encounter: Secondary | ICD-10-CM | POA: Diagnosis not present

## 2016-01-01 LAB — URINALYSIS, ROUTINE W REFLEX MICROSCOPIC
Bilirubin Urine: NEGATIVE
Glucose, UA: NEGATIVE mg/dL
Hgb urine dipstick: NEGATIVE
Ketones, ur: NEGATIVE mg/dL
Nitrite: NEGATIVE
PROTEIN: NEGATIVE mg/dL
Specific Gravity, Urine: 1.018 (ref 1.005–1.030)
pH: 6.5 (ref 5.0–8.0)

## 2016-01-01 LAB — PREGNANCY, URINE: PREG TEST UR: NEGATIVE

## 2016-01-01 LAB — URINE MICROSCOPIC-ADD ON

## 2016-01-01 MED ORDER — OXYCODONE-ACETAMINOPHEN 5-325 MG PO TABS: 1.0000 | ORAL_TABLET | ORAL | Status: DC | PRN

## 2016-01-01 MED ORDER — PREDNISONE 10 MG (21) PO TBPK: 10.0000 mg | ORAL_TABLET | Freq: Every day | ORAL | Status: DC

## 2016-01-01 MED ORDER — OXYCODONE-ACETAMINOPHEN 5-325 MG PO TABS
1.0000 | ORAL_TABLET | Freq: Once | ORAL | Status: AC
  Administered 2016-01-01: 1 via ORAL
  Filled 2016-01-01: qty 1

## 2016-01-01 MED ORDER — DIAZEPAM 5 MG PO TABS
5.0000 mg | ORAL_TABLET | Freq: Once | ORAL | Status: AC
  Administered 2016-01-01: 5 mg via ORAL
  Filled 2016-01-01: qty 1

## 2016-01-01 MED ORDER — DIAZEPAM 5 MG PO TABS: 5.0000 mg | ORAL_TABLET | Freq: Two times a day (BID) | ORAL | Status: DC

## 2016-01-01 MED ORDER — DEXAMETHASONE SODIUM PHOSPHATE 10 MG/ML IJ SOLN
10.0000 mg | Freq: Once | INTRAMUSCULAR | Status: AC
  Administered 2016-01-01: 10 mg via INTRAMUSCULAR
  Filled 2016-01-01: qty 1

## 2016-01-01 NOTE — Discharge Instructions (Signed)
Back Injury Prevention  Back injuries can be very painful. They can also be difficult to heal. After having one back injury, you are more likely to injure your back again. It is important to learn how to avoid injuring or re-injuring your back. The following tips can help you to prevent a back injury.  WHAT SHOULD I KNOW ABOUT PHYSICAL FITNESS?  · Exercise for 30 minutes per day on most days of the week or as told by your doctor. Make sure to:  ¨ Do aerobic exercises, such as walking, jogging, biking, or swimming.  ¨ Do exercises that increase balance and strength, such as tai chi and yoga.  ¨ Do stretching exercises. This helps with flexibility.  ¨ Try to develop strong belly (abdominal) muscles. Your belly muscles help to support your back.  · Stay at a healthy weight. This helps to decrease your risk of a back injury.  WHAT SHOULD I KNOW ABOUT MY DIET?  · Talk with your doctor about your overall diet. Take supplements and vitamins only as told by your doctor.  · Talk with your doctor about how much calcium and vitamin D you need each day. These nutrients help to prevent weakening of the bones (osteoporosis).  · Include good sources of calcium in your diet, such as:    Dairy products.    Green leafy vegetables.    Products that have had calcium added to them (fortified).  · Include good sources of vitamin D in your diet, such as:    Milk.    Foods that have had vitamin D added to them.  WHAT SHOULD I KNOW ABOUT MY POSTURE?  · Sit up straight and stand up straight. Avoid leaning forward when you sit or hunching over when you stand.  · Choose chairs that have good low-back (lumbar) support.  · If you work at a desk, sit close to it so you do not need to lean over. Keep your chin tucked in. Keep your neck drawn back. Keep your elbows bent so your arms look like the letter "L" (right angle).  · Sit high and close to the steering wheel when you drive. Add a low-back support to your car seat, if needed.  · Avoid sitting  or standing in one position for very long. Take breaks to get up, stretch, and walk around at least one time every hour. Take breaks every hour if you are driving for long periods of time.  · Sleep on your side with your knees slightly bent, or sleep on your back with a pillow under your knees. Do not lie on the front of your body to sleep.  WHAT SHOULD I KNOW ABOUT LIFTING, TWISTING, AND REACHING  Lifting and Heavy Lifting   · Avoid heavy lifting, especially lifting over and over again. If you must do heavy lifting:    Stretch before lifting.    Work slowly.    Rest between lifts.    Use a tool such as a cart or a dolly to move objects if one is available.    Make several small trips instead of carrying one heavy load.    Ask for help when you need it, especially when moving big objects.  · Follow these steps when lifting:    Stand with your feet shoulder-width apart.    Get as close to the object as you can. Do not pick up a heavy object that is far from your body.    Use handles or lifting   as close to the center of your body as possible.  Follow these steps when putting down a heavy load:  Stand with your feet shoulder-width apart.  Lower the object slowly while you tighten the muscles in your legs, belly, and butt. Keep the object as close to the center of your body as possible.  Keep your shoulders back. Keep your chin tucked in. Keep your back straight.  Bend at your knees. Squat down, but keep your heels off the floor.  Use handles or lifting straps if they are available. Twisting and Reaching  Avoid lifting heavy objects above your waist.  Do not twist at your waist while you are lifting or carrying a load. If  you need to turn, move your feet.  Do not bend over without bending at your knees.  Avoid reaching over your head, across a table, or for an object on a high surface.  WHAT ARE SOME OTHER TIPS?  Avoid wet floors and icy ground. Keep sidewalks clear of ice to prevent falls.   Do not sleep on a mattress that is too soft or too hard.   Keep items that you use often within easy reach.   Put heavier objects on shelves at waist level, and put lighter objects on lower or higher shelves.  Find ways to lower your stress, such as:  Exercise.  Massage.  Relaxation techniques.  Talk with your doctor if you feel anxious or depressed. These conditions can make back pain worse.  Wear flat heel shoes with cushioned soles.  Avoid making quick (sudden) movements.  Use both shoulder straps when carrying a backpack.  Do not use any tobacco products, including cigarettes, chewing tobacco, or electronic cigarettes. If you need help quitting, ask your doctor.   This information is not intended to replace advice given to you by your health care provider. Make sure you discuss any questions you have with your health care provider.   Document Released: 02/03/2008 Document Revised: 01/01/2015 Document Reviewed: 08/21/2014 Elsevier Interactive Patient Education [employer].

## 2016-01-01 NOTE — ED Provider Notes (Signed)
CSN: [id number]     Arrival date & time 01/01/16  1354 History   First MD Initiated Contact with Patient 01/01/16 1605     Chief Complaint  Patient presents with  . Back Pain  . Leg Pain   PT HAS BEEN HAVING BACK PAIN FOR THE PAST FEW WEEKS.  [facility] AFTER A 2 DAY HX OF LBP.  SHE WAS GIVEN MEDS AND SX IMPROVED, BUT NOW SHE HAS UPPER AND LOWER BACK PAIN WITH NUMBNESS IN HER ARMS AND LEGS.  PT IS AMBULATORY AND DENIES CP, SOB, LOSS OF BOWEL OR BLADDER CONTROL.  (Consider location/radiation/quality/duration/timing/severity/associated sxs/prior Treatment) Patient is a 40 y.o. female presenting with back pain and leg pain. The history is provided by the patient.  Back Pain Location:  Generalized Quality:  Aching Pain severity:  Moderate Onset quality:  Gradual Timing:  Constant Progression:  Worsening Chronicity:  Recurrent Associated symptoms: leg pain   Leg Pain Associated symptoms: back pain     Past Medical History  Diagnosis Date  . Asthma   . Stroke [facility](HCC)    Past Surgical History  Procedure Laterality Date  . Dilation and curettage of uterus     No family history on file. Social History  Substance Use Topics  . Smoking status: Former Games developermoker  . Smokeless tobacco: Never Used  . Alcohol Use: Yes     Comment: occasion   OB History    No data available     Review of Systems  Musculoskeletal: Positive for back pain.  All other systems reviewed and are negative.     Allergies  Benadryl  Home Medications   Prior to Admission medications   Medication Sig Start Date End Date Taking? Authorizing Provider  acetaminophen (TYLENOL) 650 MG CR tablet Take 650 mg by mouth every 8 (eight) hours as needed for pain.   Yes Historical Provider, MD  ibuprofen (ADVIL,MOTRIN) 600 MG tablet Take 1 tablet (600 mg total) by mouth every 6 (six) hours as needed. 12/11/15  Yes Melton KrebsSamantha Nicole Riley, PA-C  calcium & magnesium carbonates (MYLANTA) [id number] MG  tablet Take 1 tablet by mouth 3 (three) times daily with meals. Patient not taking: Reported on 12/11/2015 09/14/15   Linwood DibblesJon Knapp, MD  diazepam (VALIUM) 5 MG tablet Take 1 tablet (5 mg total) by mouth 2 (two) times daily. 01/01/16   Jacalyn LefevreJulie Maikel Neisler, MD  diphenhydrAMINE (BENADRYL) 25 MG tablet Take 2 tablets (50 mg total) by mouth every 4 (four) hours as needed for itching. Patient not taking: Reported on 09/14/2015 12/10/14   Joycie PeekBenjamin Cartner, PA-C  doxycycline (VIBRAMYCIN) 100 MG capsule Take 1 capsule (100 mg total) by mouth 2 (two) times daily. Patient not taking: Reported on 09/14/2015 12/17/14   Marlon Peliffany Greene, PA-C  oxyCODONE-acetaminophen (PERCOCET/ROXICET) 5-325 MG tablet Take 1 tablet by mouth every 4 (four) hours as needed for severe pain. 01/01/16   Jacalyn LefevreJulie Maleeya Peterkin, MD  predniSONE (STERAPRED UNI-PAK 21 TAB) 10 MG (21) TBPK tablet Take 1 tablet (10 mg total) by mouth daily. Take 6 tabs by mouth daily  for 2 days, then 5 tabs for 2 days, then 4 tabs for 2 days, then 3 tabs for 2 days, 2 tabs for 2 days, then 1 tab by mouth daily for 2 days 01/01/16   Jacalyn LefevreJulie Katanya Schlie, MD   BP 120/56 mmHg  Pulse 73  Temp(Src) 98.9 F (37.2 C) (Oral)  Resp 18  SpO2 96%  LMP 11/22/2015 Physical Exam  Constitutional: She  is oriented to person, place, and time. She appears well-developed and well-nourished.  HENT:  Head: Normocephalic and atraumatic.  Right Ear: External ear normal.  Left Ear: External ear normal.  Mouth/Throat: Oropharynx is clear and moist.  Eyes: Conjunctivae are normal. Pupils are equal, round, and reactive to light.  Neck: Normal range of motion. Neck supple.  Cardiovascular: Normal rate, regular rhythm, normal heart sounds and intact distal pulses.   Pulmonary/Chest: Effort normal and breath sounds normal.  Abdominal: Soft. Bowel sounds are normal.  Musculoskeletal: She exhibits tenderness.       Arms: Neurological: She is alert and oriented to person, place, and time.  Skin: Skin is warm and  dry.  Psychiatric: Her behavior is normal. Her mood appears anxious.  Nursing note and vitals reviewed.   ED Course  Procedures (including critical care time) Labs Review Labs Reviewed  URINALYSIS, ROUTINE W REFLEX MICROSCOPIC (NOT AT [facility]) - Abnormal; Notable for the following:    APPearance CLOUDY (*)    Leukocytes, UA MODERATE (*)    All other components within normal limits  URINE MICROSCOPIC-ADD ON - Abnormal; Notable for the following:    Squamous Epithelial / LPF 6-30 (*)    Bacteria, UA FEW (*)    All other components within normal limits  PREGNANCY, URINE    Imaging Review Dg Cervical Spine Complete  01/01/2016  CLINICAL DATA:  Neck pain radiating to arms and hands. EXAM: CERVICAL SPINE - COMPLETE 4+ VIEW COMPARISON:  None. FINDINGS: C1 to the superior endplate of T1 is imaged on the provided lateral radiograph. There is reversal of the expected cervical lordosis with kyphosis centered about the C4-C5 articulation. The bilateral facets are normally aligned. The dens is normally positioned between the lateral masses of C1. Cervical vertebral body heights are preserved. Prevertebral soft tissues are normal. Mild multilevel cervical spine DDD, worse at C5-C6 with disc space height loss, endplate irregularity sclerosis. The bilateral neural foramina appear patent given obliquity. Regional soft tissues appear normal. Limited visualization of the lung apices is normal. IMPRESSION: 1. Straightening and reversal of the expected cervical lordosis, nonspecific though could be seen in the setting of muscle spasm. 2. Mild multilevel cervical spine DDD, worst C5-C6. Electronically Signed   By: Simonne Come M.D.   On: 01/01/2016 17:39   Dg Lumbar Spine Complete  01/01/2016  CLINICAL DATA:  Neck and back pain for the past week. No known injury. EXAM: LUMBAR SPINE - COMPLETE 4+ VIEW COMPARISON:  CT abdomen pelvis - 09/14/2015 FINDINGS: There are 5 non rib-bearing lumbar type vertebral bodies. Normal  alignment of lumbar spine. No anterolisthesis or retrolisthesis. No definite pars defects. Lumbar vertebral body heights are preserved. Intervertebral disc space heights are preserved. Limited visualization the bilateral SI joints is normal. Multiple phleboliths are seen overlying the lower pelvis bilaterally. Nonobstructive bowel gas pattern. IMPRESSION: Normal radiographs of the lumbar spine. Electronically Signed   By: Simonne Come M.D.   On: 01/01/2016 17:38   I have personally reviewed and evaluated these images and lab results as part of my medical decision-making.   EKG Interpretation None      MDM  PT IS FEELING BETTER.  SHE IS TOLD TO F/U WITH NEURO AND TO FIND A PCP. Final diagnoses:  DDD (degenerative disc disease), cervical  Cervical strain, acute, initial encounter  Lumbar spine strain, initial encounter       Jacalyn Lefevre, MD 01/01/16 1831

## 2016-01-01 NOTE — ED Notes (Addendum)
Patient here with complaints of back pain "shooting all over body". Pain 10/10. Ambulatory. Denies loss of bowel/bladder control.

## 2016-01-01 NOTE — ED Notes (Signed)
MD at bedside. 

## 2016-01-13 ENCOUNTER — Ambulatory Visit (INDEPENDENT_AMBULATORY_CARE_PROVIDER_SITE_OTHER): Payer: 59 | Admitting: Neurology

## 2016-01-13 ENCOUNTER — Encounter: Payer: Self-pay | Admitting: Neurology

## 2016-01-13 VITALS — BP 138/92 | HR 78 | Resp 16 | Ht 62.0 in | Wt 207.5 lb

## 2016-01-13 DIAGNOSIS — J45909 Unspecified asthma, uncomplicated: Secondary | ICD-10-CM | POA: Insufficient documentation

## 2016-01-13 DIAGNOSIS — M5431 Sciatica, right side: Secondary | ICD-10-CM | POA: Insufficient documentation

## 2016-01-13 DIAGNOSIS — G4700 Insomnia, unspecified: Secondary | ICD-10-CM | POA: Diagnosis not present

## 2016-01-13 DIAGNOSIS — M542 Cervicalgia: Secondary | ICD-10-CM | POA: Diagnosis not present

## 2016-01-13 DIAGNOSIS — Z8673 Personal history of transient ischemic attack (TIA), and cerebral infarction without residual deficits: Secondary | ICD-10-CM | POA: Diagnosis not present

## 2016-01-13 DIAGNOSIS — M5432 Sciatica, left side: Secondary | ICD-10-CM

## 2016-01-13 DIAGNOSIS — M5412 Radiculopathy, cervical region: Secondary | ICD-10-CM | POA: Insufficient documentation

## 2016-01-13 MED ORDER — GABAPENTIN 300 MG PO CAPS: ORAL_CAPSULE | ORAL | Status: DC

## 2016-01-13 MED ORDER — CYCLOBENZAPRINE HCL 5 MG PO TABS: 5.0000 mg | ORAL_TABLET | Freq: Three times a day (TID) | ORAL | Status: DC | PRN

## 2016-01-13 NOTE — Patient Instructions (Signed)
I called in prescriptions to the pharmacy.  Gabapentin 300 mg pills (for nerve pain): Take 1 in the morning, 1 in the evening and 2 at bedtime. This medicine may make you sleepy which is good at bedtime but if it makes her sleepy during the daytime, cut out one of the daytime doses  Cyclobenzaprine 10 mg (muscle relaxant):  Take up to 3 times a day. If it makes her sleepy, just take at bedtime.

## 2016-01-13 NOTE — Progress Notes (Signed)
[facility]  PATIENT: Marissa Benton DOB: [birth year]  REFERRING DOCTOR OR PCP:  none SOURCE: records in EMR, patient, reports in EMR, images  _________________________________   HISTORICAL  CHIEF COMPLAINT:  Chief Complaint  Patient presents with  . Neck Pain    Marissa Benton is here with her husband Marissa Benton for eval of neck and lbp, onset early April.  Seen for same at [facility] ER.  X-rays of neck and L-S spine 01-01-16.  Sts. neck pain radiates down both arms to hands and lbp radiates down both legs, right worse than left/fim  . Back Pain    HISTORY OF PRESENT ILLNESS:  I had the pleasure seeing your patient, Marissa Benton, at [facility] for neurologic consultation regarding her neck and back pain.    She is a 40 year old right handed woman who had the onset of back and neck pain around 12/09/2015. She has been seen at [facility] emergency rooms over the last month.  Initially, neck pain started in the neck but over the last few weeks, she also has had pain radiating down the right > left arm.     Pain goes into the right arm and to digits 2,3 and 4.   She feels weaker in the right arm and there is some numbness going down the arm with a painful tingling.   Using the right arm and raising the arm and placing behind her head increases the pain.     Lower back pain is worse in the buttocks and pain shoots down into both legs.     She reports painful tingling going into the legs from the buttocks.    Pain is worse when she is sitting or laying down.   Pain is mild when she is moving around or walking.   She notes legs feels mildly weak.   She notes some numbness.    She denies bladder or bowel changes.  In the ED, she was prescribed Percocet, valium and a steroid pack.  She does not think there was much benefit.     I reviewed the  xray reports and images (on PACS).    Cervical spine x-ray 01/01/2016 showed reversal of the cervical  spine curvature and DJD changes, most pronounced at C5-C6.   Lumbar spine x-rays and SI joints were normal.  Sleep has been worse since pain increased as she has trouble getting comfortable at night.   In the past, she had a stroke causing mild left weakness.  This occurred around 2012 or 2013 and she was seen in [facility].  She feels the recovery has been good.      REVIEW OF SYSTEMS: Constitutional: No fevers, chills, sweats, or change in appetite   Sleep is worse recently Eyes: No visual changes, double vision, eye pain Ear, nose and throat: No hearing loss, ear pain, nasal congestion, sore throat Cardiovascular: No chest pain, palpitations Respiratory: No shortness of breath at rest or with exertion.   No wheezes.    H/o asthma GastrointestinaI: No nausea, vomting, diarrhea, abdominal pain, fecal incontinence Genitourinary: No dysuria, urinary retention or frequency.  No nocturia. Musculoskeletal: as above Integumentary: No rash, pruritus, skin lesions Neurological: as above Psychiatric: No depression at this time.  No anxiety Endocrine: No palpitations, diaphoresis, change in appetite, change in weigh or increased thirst Hematologic/Lymphatic: No anemia, purpura, petechiae. Allergic/Immunologic: No itchy/runny eyes, nasal congestion, recent allergic reactions, rashes  ALLERGIES: Allergies  Allergen Reactions  . Benadryl [  Diphenhydramine] Hives    Benadryl IV.  Can take PO Benadryl     HOME MEDICATIONS:  Current outpatient prescriptions:  .  acetaminophen (TYLENOL) 650 MG CR tablet, Take 650 mg by mouth every 8 (eight) hours as needed for pain., Disp: , Rfl:  .  calcium & magnesium carbonates (MYLANTA) 311-232 MG tablet, Take 1 tablet by mouth 3 (three) times daily with meals. (Patient not taking: Reported on 12/11/2015), Disp: 30 tablet, Rfl: 0 .  diazepam (VALIUM) 5 MG tablet, Take 1 tablet (5 mg total) by mouth 2 (two) times daily., Disp: 10 tablet, Rfl: 0 .   diphenhydrAMINE (BENADRYL) 25 MG tablet, Take 2 tablets (50 mg total) by mouth every 4 (four) hours as needed for itching. (Patient not taking: Reported on 09/14/2015), Disp: 20 tablet, Rfl: 0 .  doxycycline (VIBRAMYCIN) 100 MG capsule, Take 1 capsule (100 mg total) by mouth 2 (two) times daily. (Patient not taking: Reported on 09/14/2015), Disp: 20 capsule, Rfl: 0 .  ibuprofen (ADVIL,MOTRIN) 600 MG tablet, Take 1 tablet (600 mg total) by mouth every 6 (six) hours as needed., Disp: 30 tablet, Rfl: 0 .  oxyCODONE-acetaminophen (PERCOCET/ROXICET) 5-325 MG tablet, Take 1 tablet by mouth every 4 (four) hours as needed for severe pain., Disp: 10 tablet, Rfl: 0 .  predniSONE (STERAPRED UNI-PAK 21 TAB) 10 MG (21) TBPK tablet, Take 1 tablet (10 mg total) by mouth daily. Take 6 tabs by mouth daily  for 2 days, then 5 tabs for 2 days, then 4 tabs for 2 days, then 3 tabs for 2 days, 2 tabs for 2 days, then 1 tab by mouth daily for 2 days, Disp: 42 tablet, Rfl: 0 .  [DISCONTINUED] omeprazole (PRILOSEC) 20 MG capsule, Take 1 capsule (20 mg total) by mouth daily. (Patient not taking: Reported on 12/11/2015), Disp: 30 capsule, Rfl: 0 .  [DISCONTINUED] promethazine (PHENERGAN) 25 MG tablet, Take 1 tablet (25 mg total) by mouth every 6 (six) hours as needed for nausea or vomiting. (Patient not taking: Reported on 12/11/2015), Disp: 30 tablet, Rfl: 0 .  [DISCONTINUED] ranitidine (ZANTAC) 150 MG tablet, Take 1 tablet (150 mg total) by mouth 2 (two) times daily. (Patient not taking: Reported on 09/14/2015), Disp: 60 tablet, Rfl: 0  PAST MEDICAL HISTORY: Past Medical History  Diagnosis Date  . Asthma   . Stroke [facility])     PAST SURGICAL HISTORY: Past Surgical History  Procedure Laterality Date  . Dilation and curettage of uterus      FAMILY HISTORY: Family History  Problem Relation Age of Onset  . Healthy Mother   . Heart disease Father   . Kidney failure Father   . Neuropathy Father     SOCIAL  HISTORY:  Social History   Social History  . Marital Status: Married    Spouse Name: N/A  . Number of Children: N/A  . Years of Education: N/A   Occupational History  . Not on file.   Social History Main Topics  . Smoking status: Former Games developer  . Smokeless tobacco: Never Used  . Alcohol Use: Yes     Comment: occasion  . Drug Use: No  . Sexual Activity: Not on file   Other Topics Concern  . Not on file   Social History Narrative     PHYSICAL EXAM  Filed Vitals:   01/13/16 0922  BP: 138/92  Pulse: 78  Resp: 16  Height: 5\' 2"  (1.575 m)  Weight: 207 lb 8 oz (94.121 kg)  Body mass index is 37.94 kg/(m^2).   General: The patient is well-developed and well-nourished and in no acute distress  Neck: The neck is supple, no carotid bruits are noted.  The Mid to lower cervical paraspinal muscles in the neck are tender.  Cardiovascular: The heart has a regular rate and rhythm with a normal S1 and S2. There were no murmurs, gallops or rubs. Lungs are clear to auscultation.  Skin: Extremities are without significant rash.  Musculoskeletal:  Back is tender over the piriformis muscles and lower lumbar paraspinal muscles. The trochanteric bursae are nontender.  Neurologic Exam  Mental status: The patient is alert and oriented x 3 at the time of the examination. The patient has apparent normal recent and remote memory, with an apparently normal attention span and concentration ability.   Speech is normal.  Cranial nerves: Extraocular movements are full.   There is good facial sensation to soft touch bilaterally.Facial strength is normal.  Trapezius and sternocleidomastoid strength is normal. No dysarthria is noted.    No obvious hearing deficits are noted.  Motor:  Muscle bulk is normal.   Tone is slightly increased in left leg. Strength is 4+/5 in the right triceps and the finger extensors. 5 / 5 elsewhere.   Sensory: Sensory testing shows reduced sensation to temperature  in the right C6 and C7 distribution and decreased sensation to touch in the C7 and C8 distribution of the right hand. Vibration sensation was normal and symmetric.   In the legs, touch sensation was symmetric. She reported decreased vibration sensation in the right leg.  Coordination: Cerebellar testing reveals good finger-nose-finger bilaterally.  Gait and station: Station is normal.   Gait is normal and tandem gait is slightly wide. Romberg is negative.   Reflexes: Deep tendon reflexes are increased on the left relative to the right with spread at the left knee. The right triceps DTR was trace.    DIAGNOSTIC DATA (LABS, IMAGING, TESTING) - I reviewed patient records, labs, notes, testing and imaging myself where available.  Lab Results  Component Value Date   WBC 6.8 12/11/2015   HGB 12.8 12/11/2015   HCT 39.5 12/11/2015   MCV 97.8 12/11/2015   PLT 252 12/11/2015      Component Value Date/Time   NA 139 12/11/2015 0903   K 4.4 12/11/2015 0903   CL 104 12/11/2015 0903   CO2 26 12/11/2015 0903   GLUCOSE 120* 12/11/2015 0903   BUN 7 12/11/2015 0903   CREATININE 0.72 12/11/2015 0903   CALCIUM 9.4 12/11/2015 0903   PROT 7.5 09/14/2015 1303   ALBUMIN 4.2 09/14/2015 1303   AST 23 09/14/2015 1303   ALT 29 09/14/2015 1303   ALKPHOS 76 09/14/2015 1303   BILITOT 0.7 09/14/2015 1303   GFRNONAA >60 12/11/2015 0903   GFRAA >60 12/11/2015 0903       ASSESSMENT AND PLAN  Right cervical radiculopathy - Plan: MR Cervical Spine Wo Contrast  Neck pain - Plan: MR Cervical Spine Wo Contrast  Bilateral sciatica  History of CVA (cerebrovascular accident)  Insomnia   She appears to have a right C7 radiculopathy and bilateral sciatica.   1.    Right the cervical spine without contrast to better evaluate the right arm weakness and numbness in a C7 distribution 2.    Gabapentin 300 mg in the morning, 300 mg in the afternoon and 600 mg at night.    Cyclobenzaprine 5 mg up to 3 times  a day.   She should  put up these medicines over a few days and skip the morning dose if she gets sleepy. 3.    Trigger point inject C5, C6 and C7 paraspinal muscles bilaterally  with 80 mg Depo-Medrol in Marcaine using sterile technique. Advised to stay active and exercise.   She tolerated the procedure well and there were no compensations. 4.    She will return to see me in 6 weeks but is advised to call if she is no better in 2 weeks. She should also call if there are new or worsening symptoms.     Marissa Benton A. Epimenio Foot, MD, PhD 01/13/2016, 9:33 AM Certified in Neurology, Clinical Neurophysiology, Sleep Medicine, Pain Medicine and Neuroimaging  [facility] [address] [place], [place] [address] [phone]

## 2016-03-11 ENCOUNTER — Ambulatory Visit: Payer: 59 | Admitting: Neurology

## 2016-03-17 ENCOUNTER — Encounter: Payer: Self-pay | Admitting: Neurology

## 2016-04-08 ENCOUNTER — Encounter (HOSPITAL_COMMUNITY): Payer: Self-pay

## 2016-04-08 ENCOUNTER — Emergency Department (HOSPITAL_COMMUNITY)
Admission: EM | Admit: 2016-04-08 | Discharge: 2016-04-09 | Disposition: A | Discharge status: Home / Self Care | Payer: 59 | Attending: Physician Assistant | Admitting: Physician Assistant

## 2016-04-08 ENCOUNTER — Emergency Department (HOSPITAL_COMMUNITY): Payer: 59

## 2016-04-08 DIAGNOSIS — R200 Anesthesia of skin: Secondary | ICD-10-CM | POA: Insufficient documentation

## 2016-04-08 DIAGNOSIS — J45909 Unspecified asthma, uncomplicated: Secondary | ICD-10-CM | POA: Insufficient documentation

## 2016-04-08 DIAGNOSIS — R0789 Other chest pain: Secondary | ICD-10-CM | POA: Insufficient documentation

## 2016-04-08 DIAGNOSIS — Z87891 Personal history of nicotine dependence: Secondary | ICD-10-CM | POA: Insufficient documentation

## 2016-04-08 DIAGNOSIS — Z8673 Personal history of transient ischemic attack (TIA), and cerebral infarction without residual deficits: Secondary | ICD-10-CM | POA: Insufficient documentation

## 2016-04-08 DIAGNOSIS — Z7982 Long term (current) use of aspirin: Secondary | ICD-10-CM | POA: Diagnosis not present

## 2016-04-08 LAB — URINALYSIS, ROUTINE W REFLEX MICROSCOPIC
GLUCOSE, UA: NEGATIVE mg/dL
Nitrite: NEGATIVE
PH: 7.5 (ref 5.0–8.0)
Protein, ur: 100 mg/dL — AB
Specific Gravity, Urine: 1.028 (ref 1.005–1.030)

## 2016-04-08 LAB — CBC
HEMATOCRIT: 44.8 % (ref 36.0–46.0)
Hemoglobin: 14.6 g/dL (ref 12.0–15.0)
MCH: 32.2 pg (ref 26.0–34.0)
MCHC: 32.6 g/dL (ref 30.0–36.0)
MCV: 98.7 fL (ref 78.0–100.0)
PLATELETS: 346 10*3/uL (ref 150–400)
RBC: 4.54 MIL/uL (ref 3.87–5.11)
RDW: 12.6 % (ref 11.5–15.5)
WBC: 11.1 10*3/uL — AB (ref 4.0–10.5)

## 2016-04-08 LAB — BASIC METABOLIC PANEL
Anion gap: 13 (ref 5–15)
BUN: 5 mg/dL — ABNORMAL LOW (ref 6–20)
CO2: 22 mmol/L (ref 22–32)
CREATININE: 1.09 mg/dL — AB (ref 0.44–1.00)
Calcium: 9.8 mg/dL (ref 8.9–10.3)
Chloride: 106 mmol/L (ref 101–111)
Glucose, Bld: 118 mg/dL — ABNORMAL HIGH (ref 65–99)
Potassium: 3.8 mmol/L (ref 3.5–5.1)
SODIUM: 141 mmol/L (ref 135–145)

## 2016-04-08 LAB — URINE MICROSCOPIC-ADD ON

## 2016-04-08 LAB — I-STAT TROPONIN, ED: Troponin i, poc: 0 ng/mL (ref 0.00–0.08)

## 2016-04-08 LAB — LIPASE, BLOOD: LIPASE: 33 U/L (ref 11–51)

## 2016-04-08 MED ORDER — OMEPRAZOLE 20 MG PO CPDR: 20.0000 mg | DELAYED_RELEASE_CAPSULE | Freq: Every day | ORAL | 0 refills | Status: DC

## 2016-04-08 MED ORDER — GI COCKTAIL ~~LOC~~
30.0000 mL | Freq: Once | ORAL | Status: AC
  Administered 2016-04-08: 30 mL via ORAL
  Filled 2016-04-08: qty 30

## 2016-04-08 MED ORDER — GADOBENATE DIMEGLUMINE 529 MG/ML IV SOLN
20.0000 mL | Freq: Once | INTRAVENOUS | Status: AC | PRN
  Administered 2016-04-08: 20 mL via INTRAVENOUS

## 2016-04-08 MED ORDER — SODIUM CHLORIDE 0.9 % IV BOLUS (SEPSIS)
1000.0000 mL | Freq: Once | INTRAVENOUS | Status: AC
  Administered 2016-04-08: 1000 mL via INTRAVENOUS

## 2016-04-08 MED ORDER — ONDANSETRON HCL 4 MG/2ML IJ SOLN
4.0000 mg | Freq: Once | INTRAMUSCULAR | Status: DC
  Filled 2016-04-08: qty 2

## 2016-04-08 NOTE — ED Provider Notes (Signed)
[facility] DEPT Provider Note   CSN: [id number] Arrival date & time: 04/08/16  1553  First Provider Contact:  None       History   Chief Complaint Chief Complaint  Patient presents with  . Emesis  . Chest Pain    HPI Marissa Benton is a 40 y.o. female.   Chest Pain   This is a new problem. The current episode started 1 to 2 hours ago. The problem occurs constantly. The problem has not changed since onset.The pain is associated with eating. The pain is present in the substernal region. The pain is at a severity of 2/10. The pain is mild. The quality of the pain is described as burning. Radiates to: Left face. Associated symptoms include vomiting. Pertinent negatives include no abdominal pain, no dizziness and no shortness of breath. She has tried nothing for the symptoms. The treatment provided no relief. Risk factors include smoking/tobacco exposure.       Past Medical History:  Diagnosis Date  . Asthma   . Stroke [facility])     Patient Active Problem List   Diagnosis Date Noted  . Asthma 01/13/2016  . Neck pain 01/13/2016  . Right cervical radiculopathy 01/13/2016  . 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 History of CVA (cerebrovascular accident) 01/13/2016  . Insomnia 01/13/2016    Past Surgical History:  Procedure Laterality Date  . DILATION AND CURETTAGE OF UTERUS      OB History    No data available       Home Medications    Prior to Admission medications   Medication Sig Start Date End Date Taking? Authorizing Provider  aspirin EC 81 MG tablet Take 81 mg by mouth daily as needed for mild pain.   Yes Historical Provider, MD  cyclobenzaprine (FLEXERIL) 5 MG tablet Take 1 tablet (5 mg total) by mouth every 8 (eight) hours as needed for muscle spasms. 01/13/16  Yes Asa Lente, MD  gabapentin (NEURONTIN) 300 MG capsule One po qAM, one po qPM and two po qHS Patient taking differently: Take 300 mg by mouth See admin instructions. One po qAM, one po qPM and two  po qHS 01/13/16  Yes Asa Lente, MD  Multiple Vitamins-Calcium (ONE-A-DAY WOMENS FORMULA PO) Take 1 tablet by mouth every other day.   Yes Historical Provider, MD  omeprazole (PRILOSEC) 20 MG capsule Take 1 capsule (20 mg total) by mouth daily. 04/08/16   Carol Loftin Lyn Kedarius Aloisi, MD    Family History Family History  Problem Relation Age of Onset  . Healthy Mother   . Heart disease Father   . Kidney failure Father   . Neuropathy Father     Social History Social History  Substance Use Topics  . Smoking status: Former Games developer  . Smokeless tobacco: Never Used  . Alcohol use Yes     Comment: occasion     Allergies   Benadryl [diphenhydramine]   Review of Systems Review of Systems  Constitutional: Negative for activity change.  Respiratory: Negative for shortness of breath.   Cardiovascular: Positive for chest pain.  Gastrointestinal: Positive for vomiting. Negative for abdominal pain.  Neurological: Positive for tremors. Negative for dizziness and facial asymmetry.  Psychiatric/Behavioral: Negative for confusion.  All other systems reviewed and are negative.    Physical Exam Updated Vital Signs BP 129/92   Pulse 91   Temp 98.1 F (36.7 C) (Oral)   Resp 21   Ht  (1.575 m)   Wt 200 lb (90.7 kg)  LMP 03/24/2016 (Approximate)   SpO2 96%   BMI 36.58 kg/m   Physical Exam  Constitutional: She is oriented to person, place, and time. She appears well-developed and well-nourished.  HENT:  Head: Normocephalic and atraumatic.  Eyes: Right eye exhibits no discharge.  Cardiovascular: Normal rate, regular rhythm and normal heart sounds.   No murmur heard. Pulmonary/Chest: Effort normal and breath sounds normal. She has no wheezes. She has no rales.  Abdominal: Soft. She exhibits no distension. There is no tenderness.  Neurological: She is oriented to person, place, and time.  Patient's initial exam showed right facial weakness for nurse. On my exam she had no facial  weakness when inflating cheeks. Nor when talking. However when asked to smile she had left decreased smile. At the end of the exam she had right smile weakness.  Pronator drift negative. Patient baseline fine tremor throughout. CN otherwise normal.   Speech is clear.   Moving all extremities.  Patient able to move all muscle of bilateral upper extremities and hold against gravity but when asked to squeeze has weakness.   Skin: Skin is warm and dry. She is not diaphoretic.  Psychiatric: She has a normal mood and affect.  Nursing note and vitals reviewed.    ED Treatments / Results  Labs (all labs ordered are listed, but only abnormal results are displayed) Labs Reviewed  BASIC METABOLIC PANEL - Abnormal; Notable for the following:       Result Value   Glucose, Bld 118 (*)    BUN 5 (*)    Creatinine, Ser 1.09 (*)    All other components within normal limits  CBC - Abnormal; Notable for the following:    WBC 11.1 (*)    All other components within normal limits  URINALYSIS, ROUTINE W REFLEX MICROSCOPIC (NOT AT [facility]) - Abnormal; Notable for the following:    Color, Urine AMBER (*)    APPearance CLOUDY (*)    Hgb urine dipstick LARGE (*)    Bilirubin Urine SMALL (*)    Ketones, ur >80 (*)    Protein, ur 100 (*)    Leukocytes, UA SMALL (*)    All other components within normal limits  URINE MICROSCOPIC-ADD ON - Abnormal; Notable for the following:    Squamous Epithelial / LPF 6-30 (*)    Bacteria, UA MANY (*)    Casts HYALINE CASTS (*)    All other components within normal limits  LIPASE, BLOOD  I-STAT TROPOININ, ED  I-STAT TROPOININ, ED  I-STAT TROPOININ, ED    EKG  EKG Interpretation None       Radiology Dg Chest 2 View  Result Date: 04/08/2016 CLINICAL DATA:  Chest pain EXAM: CHEST  2 VIEW COMPARISON:  02/14/2014 FINDINGS: Cardiomediastinal silhouette is stable. No acute infiltrate or pleural effusion. No pulmonary edema. Bony thorax is unremarkable. IMPRESSION: No  active cardiopulmonary disease. Electronically Signed   By: Natasha MeadLiviu  Pop M.D.   On: 04/08/2016 16:56   Ct Head Wo Contrast  Result Date: 04/08/2016 CLINICAL DATA:  Left-sided numbness EXAM: CT HEAD WITHOUT CONTRAST TECHNIQUE: Contiguous axial images were obtained from the base of the skull through the vertex without intravenous contrast. COMPARISON:  None. FINDINGS: The bony calvarium is intact. No soft tissue abnormality is seen. No findings to suggest acute hemorrhage, acute infarction or space-occupying mass lesion are noted. IMPRESSION: No acute abnormality noted. Electronically Signed   By: Alcide CleverMark  Lukens M.D.   On: 04/08/2016 19:15   Mr Laqueta JeanBrain W ZOWo  Contrast  Result Date: 04/08/2016 CLINICAL DATA:  Initial evaluation for acute chest pain with emesis, associated with facial numbness. EXAM: MRI HEAD WITHOUT AND WITH CONTRAST TECHNIQUE: Multiplanar, multiecho pulse sequences of the brain and surrounding structures were obtained without and with intravenous contrast. CONTRAST:  20mL MULTIHANCE GADOBENATE DIMEGLUMINE 529 MG/ML IV SOLN COMPARISON:  Prior CT from earlier the same day. FINDINGS: Study mildly degraded by motion artifact. Cerebral volume within normal limits for patient age. No focal parenchymal signal abnormality identified. No abnormal foci of restricted diffusion to suggest acute ischemia. Gray-white matter differentiation well maintained. Normal intracranial vascular flow voids are preserved. No acute or chronic intracranial hemorrhage. No areas of chronic infarction identified. No mass lesion, midline shift, or mass effect. No hydrocephalus. No extra-axial fluid collection. Major dural sinuses are grossly patent. No abnormal enhancement. Craniocervical junction within normal limits. Partially visualized upper cervical spine grossly unremarkable, although evaluation somewhat limited by motion artifact. Incidental note made of an empty sella. No acute abnormality about the globes and orbits.  Paranasal sinuses are largely clear. No mastoid effusion. Inner ear structures grossly normal. Bone marrow signal intensity within normal limits. No scalp soft tissue abnormality. IMPRESSION: 1. No acute intracranial infarct or other process identified. 2. Empty sella. While this is often an incidental finding of no clinical significance, this is sometimes associated with elevated intracranial pressures. 3. Otherwise negative brain MRI. Electronically Signed   By: Rise Mu M.D.   On: 04/08/2016 22:27    Procedures Procedures (including critical care time)  Medications Ordered in ED Medications  sodium chloride 0.9 % bolus 1,000 mL (0 mLs Intravenous Stopped 04/08/16 2231)  gi cocktail (Maalox,Lidocaine,Donnatal) (30 mLs Oral Given 04/08/16 2007)  gadobenate dimeglumine (MULTIHANCE) injection 20 mL (20 mLs Intravenous Contrast Given 04/08/16 2148)     Initial Impression / Assessment and Plan / ED Course  I have reviewed the triage vital signs and the nursing notes.  Pertinent labs & imaging results that were available during my care of the patient were reviewed by me and considered in my medical decision making (see chart for details).  Clinical Course    Patient is a 40 year old female presenting today initially with emesis and chest pain. She reports chest pains across her chest. This happened after she ate a tuna sandwich. Then she reports that she had some strange feeling in her face. As well as in her hand. Her neurologic exam has been fluctuating. It appears inconsistent both over time and to a specific distribution.  We will get a CAT scan. We'll get labs. Patient says that she had a "stroke". However she was never put on any medication. [facility].  Given the patient's neurologic complaints not following type pattern that is consistent with an neurologic etiology that I'm familiar with,. We will treat her anxiety and tearfulness initially.   Get labs, head CT scan and then  MRI of patient's symptoms are not controlled.   Patient's MRI is negative.  Delta trop negative.  Unsure of a unifying diagnosis for this female given her subjectively abnormal and changing neurologic exam, chest pain. However think we have ruled out dangerous pathology that needs addressing currently. With MRI delta troponins and normal labs. We will have patient follow up with her primary care physician.    Final Clinical Impressions(s) / ED Diagnoses   Final diagnoses:  Numbness  Atypical chest pain    New Prescriptions Discharge Medication List as of 04/08/2016 11:59 PM    START taking  these medications   Details  omeprazole (PRILOSEC) 20 MG capsule Take 1 capsule (20 mg total) by mouth daily., Starting Wed 04/08/2016, Print         Michiko Lineman Randall An, MD 04/09/16 [phone]

## 2016-04-08 NOTE — ED Notes (Signed)
Pt is not feeling nauseous at this time

## 2016-04-08 NOTE — ED Triage Notes (Signed)
Per Pt, Pt is coming from home with complaints of vomiting after she ate a [employer] sandwich and juice. Pt reports receiving chest pain during episode that did not release after the vomiting stopped. Complains of posterior head pain that has reccured for a couple days.

## 2016-04-08 NOTE — Discharge Instructions (Signed)
We are unsure what is causing her pain. Please take this pill to help decrease your acid. The acid could be causing the pain.  Please follow-up with your primary care physician.

## 2016-04-09 LAB — I-STAT TROPONIN, ED
TROPONIN I, POC: 0 ng/mL (ref 0.00–0.08)
Troponin i, poc: 0 ng/mL (ref 0.00–0.08)

## 2016-04-09 NOTE — ED Notes (Signed)
Pt stable, ambulatory, states understanding of discharge instructions 

## 2016-12-08 ENCOUNTER — Encounter (HOSPITAL_COMMUNITY): Payer: Self-pay

## 2016-12-08 ENCOUNTER — Emergency Department (HOSPITAL_COMMUNITY)
Admission: EM | Admit: 2016-12-08 | Discharge: 2016-12-08 | Disposition: A | Discharge status: Home / Self Care | Payer: 59 | Attending: Emergency Medicine | Admitting: Emergency Medicine

## 2016-12-08 ENCOUNTER — Emergency Department (HOSPITAL_COMMUNITY): Payer: 59

## 2016-12-08 DIAGNOSIS — M7989 Other specified soft tissue disorders: Secondary | ICD-10-CM | POA: Diagnosis not present

## 2016-12-08 DIAGNOSIS — Y929 Unspecified place or not applicable: Secondary | ICD-10-CM | POA: Diagnosis not present

## 2016-12-08 DIAGNOSIS — Z87891 Personal history of nicotine dependence: Secondary | ICD-10-CM | POA: Insufficient documentation

## 2016-12-08 DIAGNOSIS — S29012A Strain of muscle and tendon of back wall of thorax, initial encounter: Secondary | ICD-10-CM | POA: Insufficient documentation

## 2016-12-08 DIAGNOSIS — S299XXA Unspecified injury of thorax, initial encounter: Secondary | ICD-10-CM | POA: Diagnosis present

## 2016-12-08 DIAGNOSIS — J45909 Unspecified asthma, uncomplicated: Secondary | ICD-10-CM | POA: Insufficient documentation

## 2016-12-08 DIAGNOSIS — T148XXA Other injury of unspecified body region, initial encounter: Secondary | ICD-10-CM

## 2016-12-08 DIAGNOSIS — Z79899 Other long term (current) drug therapy: Secondary | ICD-10-CM | POA: Diagnosis not present

## 2016-12-08 DIAGNOSIS — Y939 Activity, unspecified: Secondary | ICD-10-CM | POA: Insufficient documentation

## 2016-12-08 DIAGNOSIS — Z8673 Personal history of transient ischemic attack (TIA), and cerebral infarction without residual deficits: Secondary | ICD-10-CM | POA: Diagnosis not present

## 2016-12-08 DIAGNOSIS — Z7982 Long term (current) use of aspirin: Secondary | ICD-10-CM | POA: Insufficient documentation

## 2016-12-08 DIAGNOSIS — Y999 Unspecified external cause status: Secondary | ICD-10-CM | POA: Diagnosis not present

## 2016-12-08 DIAGNOSIS — W01198A Fall on same level from slipping, tripping and stumbling with subsequent striking against other object, initial encounter: Secondary | ICD-10-CM | POA: Diagnosis not present

## 2016-12-08 DIAGNOSIS — W19XXXA Unspecified fall, initial encounter: Secondary | ICD-10-CM

## 2016-12-08 NOTE — Discharge Instructions (Signed)
You should take your muscle relaxer and gabapentin 3 times a day and call your neurologist on Thursday for worsening symptoms

## 2016-12-08 NOTE — ED Triage Notes (Signed)
Per Pt, Pt is coming from home with complaints of back pain that started after she slipped and fell last Thursday. Pt denies any urinary or bowel changes. Pt reports having hx of muscle spasms.

## 2016-12-08 NOTE — ED Provider Notes (Signed)
[facility] Provider Note   CSN: [id number] Arrival date & time: 12/08/16  1511     History   Chief Complaint Chief Complaint  Patient presents with  . Back Pain    HPI Marissa Benton is a 41 y.o. female.  Pt comes in with c/o back pain that is ongoing after a fall 5 days ago. She states that she fell slipped and fell directly on her back. She has tried heat, ice and muscle relaxers without relief. Denies numbness or weakness that is different then normal. The pain is mid back.      Past Medical History:  Diagnosis Date  . Asthma   . Stroke [facility])     Patient Active Problem List   Diagnosis Date Noted  . Asthma 01/13/2016  . Neck pain 01/13/2016  . Right cervical radiculopathy 01/13/2016  . 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 History of CVA (cerebrovascular accident) 01/13/2016  . Insomnia 01/13/2016    Past Surgical History:  Procedure Laterality Date  . DILATION AND CURETTAGE OF UTERUS      OB History    No data available       Home Medications    Prior to Admission medications   Medication Sig Start Date End Date Taking? Authorizing Provider  aspirin EC 81 MG tablet Take 81 mg by mouth daily as needed for mild pain.    Historical Provider, MD  cyclobenzaprine (FLEXERIL) 5 MG tablet Take 1 tablet (5 mg total) by mouth every 8 (eight) hours as needed for muscle spasms. 01/13/16   Asa Lente, MD  gabapentin (NEURONTIN) 300 MG capsule One po qAM, one po qPM and two po qHS Patient taking differently: Take 300 mg by mouth See admin instructions. One po qAM, one po qPM and two po qHS 01/13/16   Asa Lente, MD  Multiple Vitamins-Calcium (ONE-A-DAY WOMENS FORMULA PO) Take 1 tablet by mouth every other day.    Historical Provider, MD  omeprazole (PRILOSEC) 20 MG capsule Take 1 capsule (20 mg total) by mouth daily. 04/08/16   Courteney Lyn Mackuen, MD    Family History Family History  Problem Relation Age of Onset  . Healthy Mother   . Heart  disease Father   . Kidney failure Father   . Neuropathy Father     Social History Social History  Substance Use Topics  . Smoking status: Former Games developer  . Smokeless tobacco: Never Used  . Alcohol use Yes     Comment: occasion     Allergies   Benadryl [diphenhydramine]   Review of Systems Review of Systems  All other systems reviewed and are negative.    Physical Exam Updated Vital Signs BP 139/89 (BP Location: Right Arm)   Pulse (!) 102   Temp 98.8 F (37.1 C) (Oral)   Resp 16   Ht  (1.575 m)   Wt 90.7 kg   SpO2 99%   BMI 36.58 kg/m   Physical Exam  Constitutional: She appears well-developed and well-nourished.  Cardiovascular: Normal rate.   Pulmonary/Chest: Effort normal.  Musculoskeletal: Normal range of motion.  Neurological: She is alert.  Mild weakness to the left upper arm. From previous cva  Skin: Skin is warm and dry.  Psychiatric: She has a normal mood and affect.  Nursing note and vitals reviewed.    ED Treatments / Results  Labs (all labs ordered are listed, but only abnormal results are displayed) Labs Reviewed - No data to display  EKG  EKG  Interpretation None       Radiology No results found.  Procedures Procedures (including critical care time)  Medications Ordered in ED Medications - No data to display   Initial Impression / Assessment and Plan / ED Course  I have reviewed the triage vital signs and the nursing notes.  Pertinent labs & imaging results that were available during my care of the patient were reviewed by me and considered in my medical decision making (see chart for details).  Clinical Course as of Dec 08 1716  Tue Dec 08, 2016  1642 DG Thoracic Spine 2 View [VP]    Clinical Course User Index [VP] Teressa Lower, NP    No acute injury noted on xray. Pt is intact at her neurologic baseline. Discussed using medicines that she already has. She can follow up with neurology for continued  symptoms  Final Clinical Impressions(s) / ED Diagnoses   Final diagnoses:  None    New Prescriptions New Prescriptions   No medications on file     Teressa Lower, NP 12/08/16 1723    Nira Conn, MD 12/08/16 2153

## 2017-08-04 IMAGING — CR DG CERVICAL SPINE COMPLETE 4+V
6 series · 6 of 6 positions shown · non-contrast
Comparison: None.

CLINICAL DATA: Neck pain radiating to arms and hands.

EXAM:
CERVICAL SPINE - COMPLETE 4+ VIEW

[w cervical spine lat]
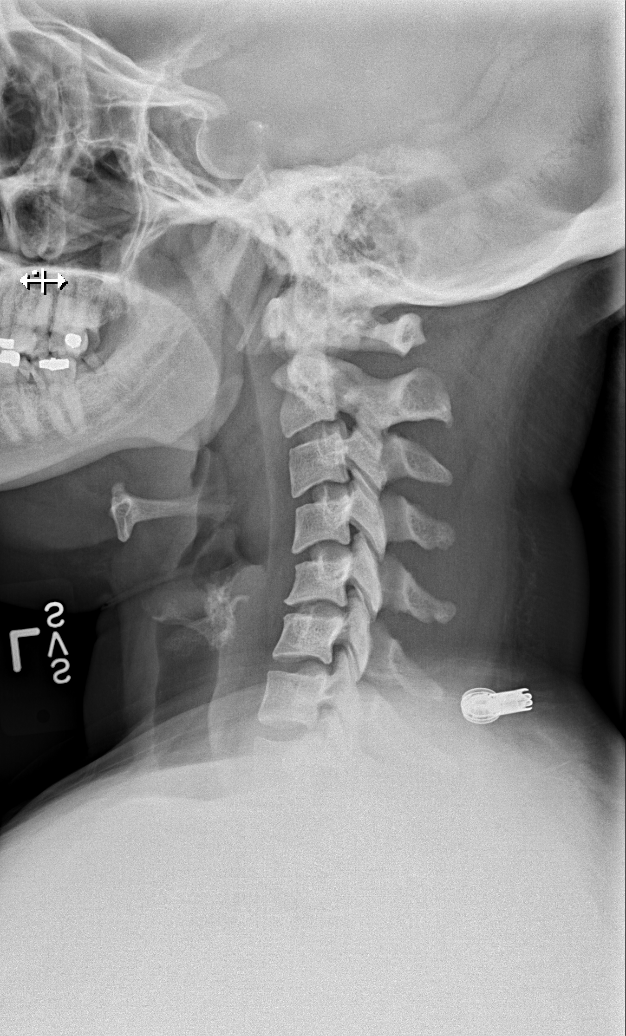

[w cervical spine ap_obl (1 of 2)]
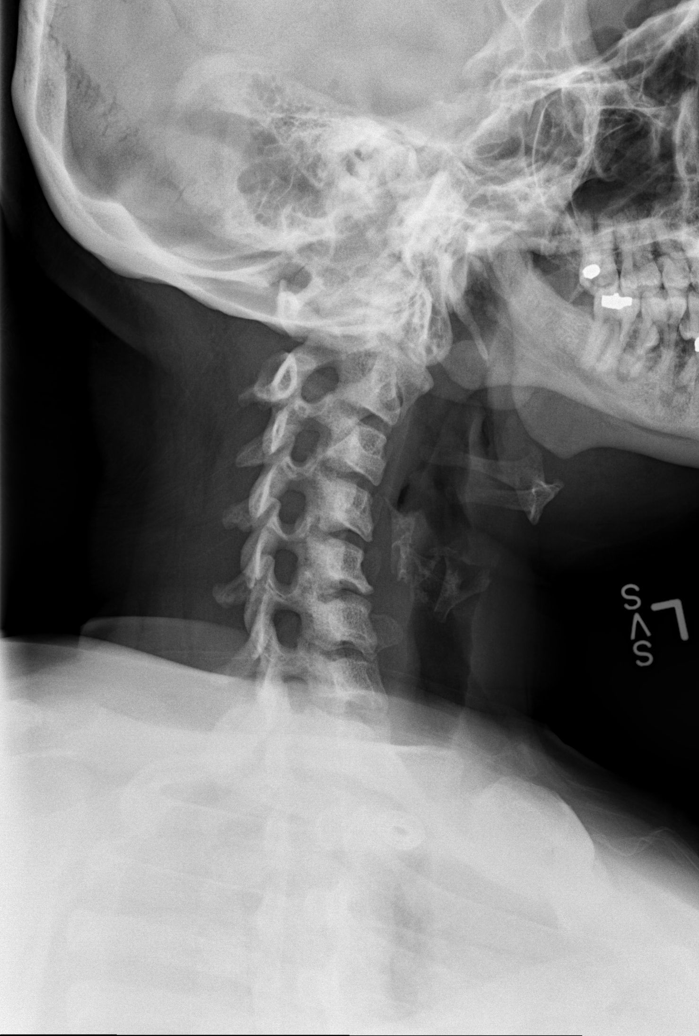

[w cervical spine ap_obl (2 of 2)]
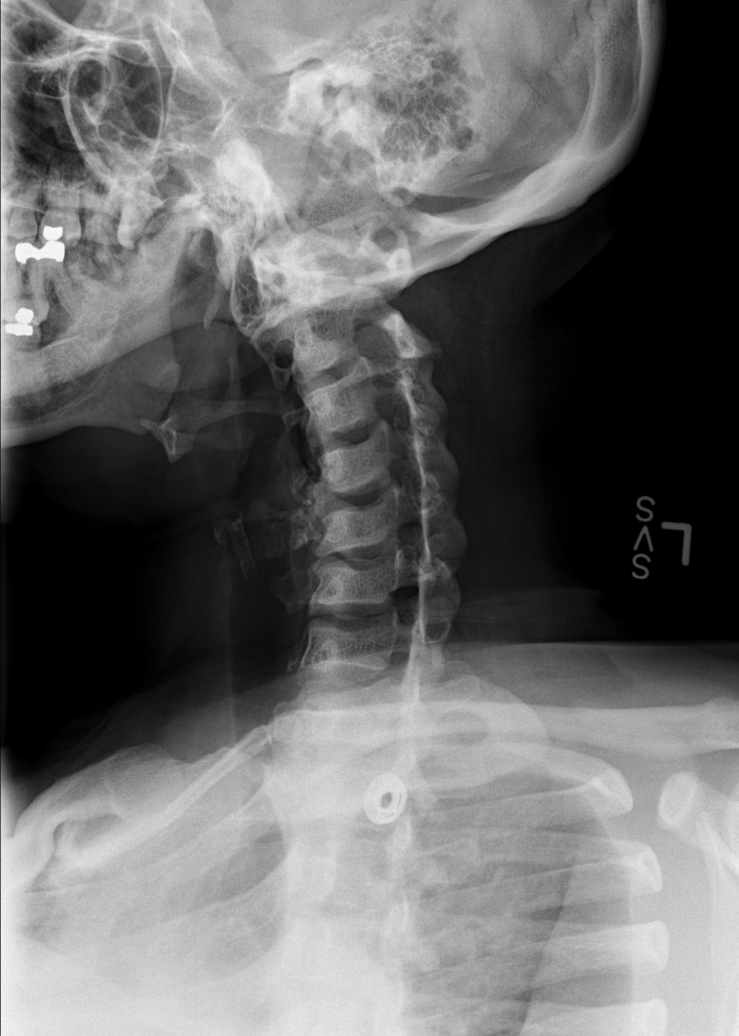

[w cervical spine ap]
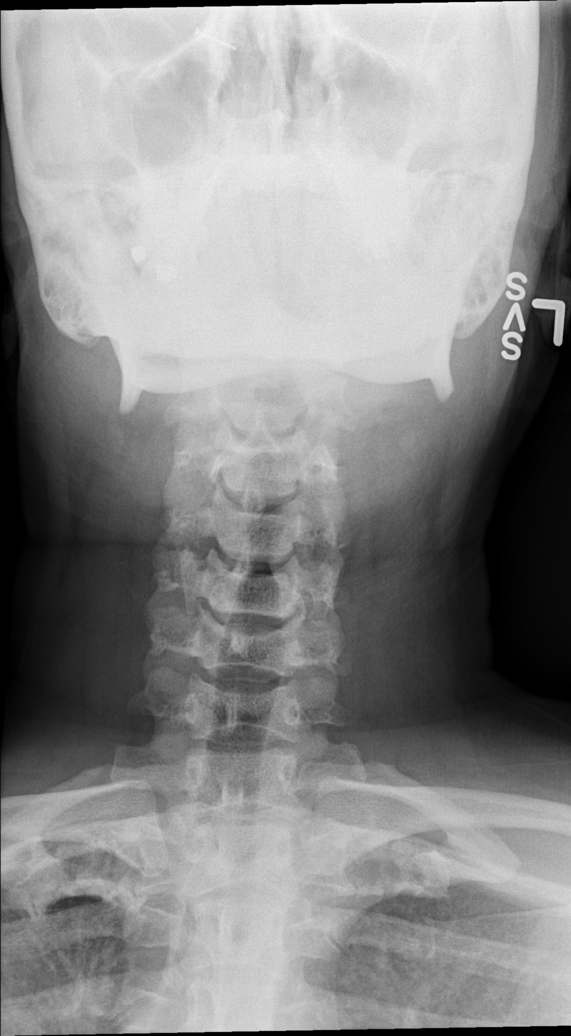

[w cervical spine odontoid]
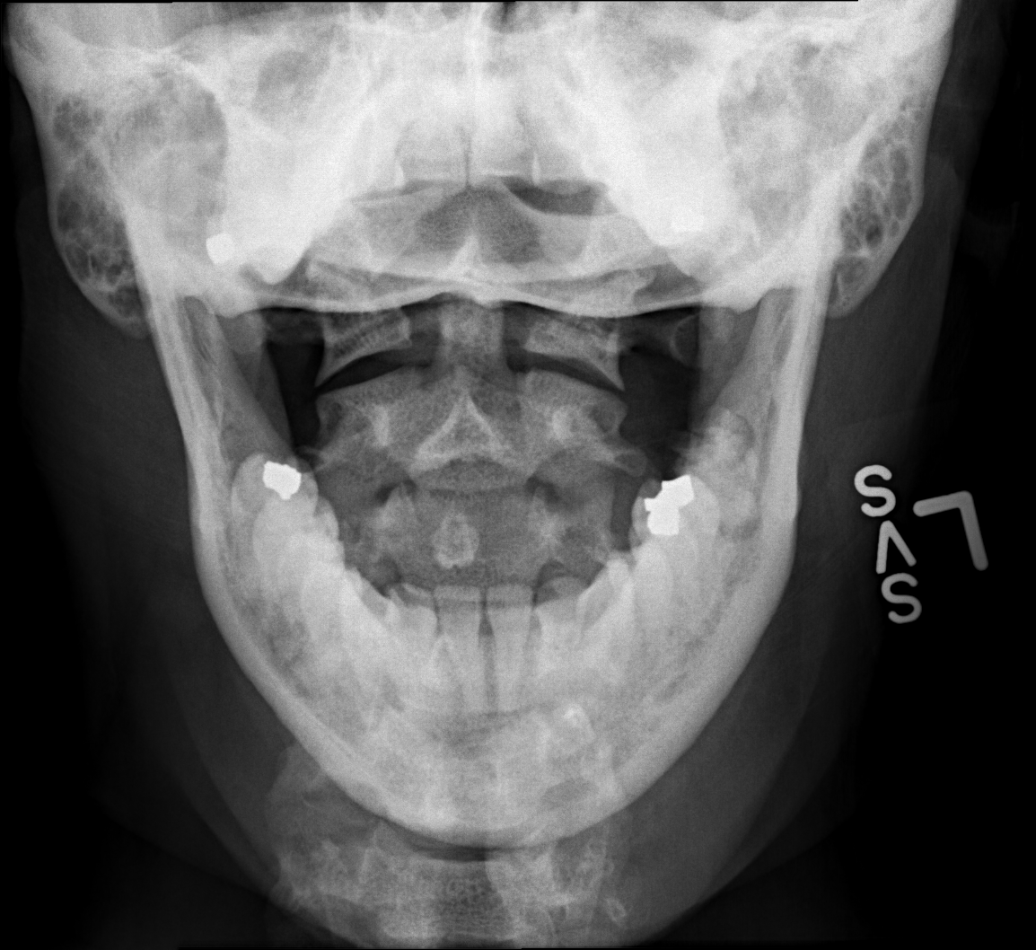

[w cervical swimmers]
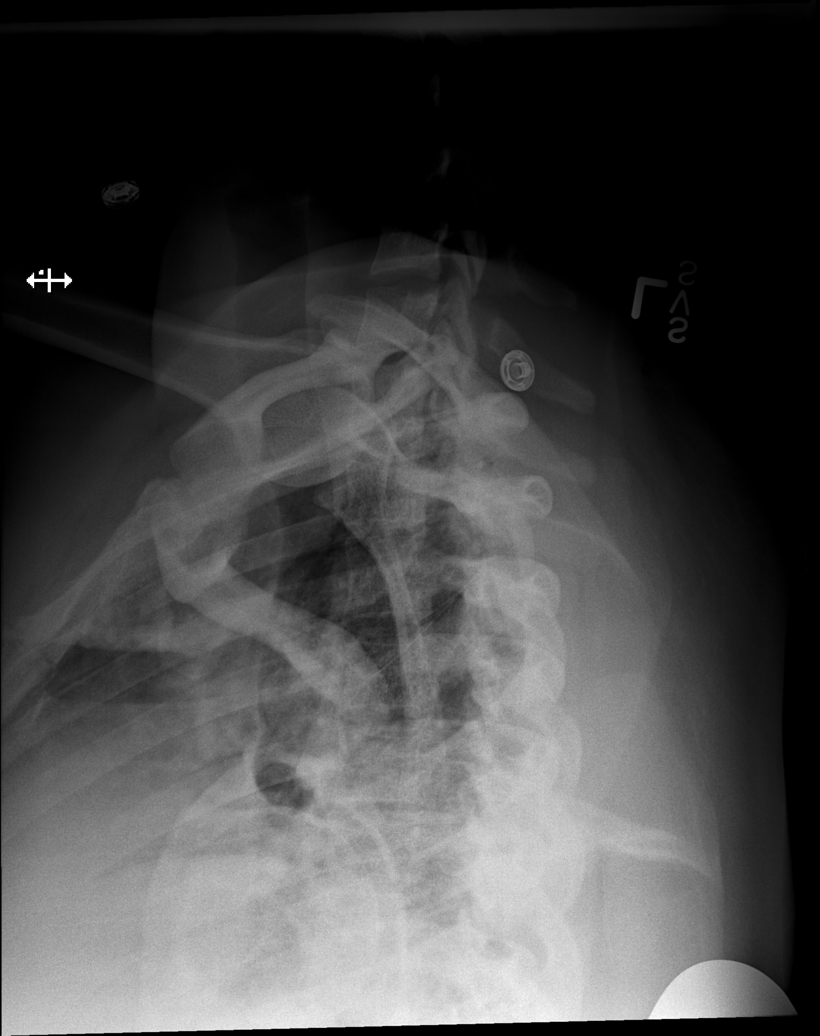

[6 of 6 positions shown; findings below may reference images not displayed]

FINDINGS: C1 to the superior endplate of T1 is imaged on the provided lateral
radiograph.

There is reversal of the expected cervical lordosis with kyphosis
centered about the C4-C5 articulation. The bilateral facets are
normally aligned. The dens is normally positioned between the
lateral masses of C1.

Cervical vertebral body heights are preserved. Prevertebral soft
tissues are normal.

Mild multilevel cervical spine DDD, worse at C5-C6 with disc space
height loss, endplate irregularity sclerosis.

The bilateral neural foramina appear patent given obliquity.

Regional soft tissues appear normal. Limited visualization of the
lung apices is normal.
IMPRESSION: 1. Straightening and reversal of the expected cervical lordosis,
nonspecific though could be seen in the setting of muscle spasm.
2. Mild multilevel cervical spine DDD, worst C5-C6.

## 2018-08-27 ENCOUNTER — Emergency Department (HOSPITAL_COMMUNITY)
Admission: EM | Admit: 2018-08-27 | Discharge: 2018-08-27 | Disposition: A | Discharge status: Home / Self Care | Payer: 59 | Attending: Emergency Medicine | Admitting: Emergency Medicine

## 2018-08-27 ENCOUNTER — Encounter (HOSPITAL_COMMUNITY): Payer: Self-pay

## 2018-08-27 ENCOUNTER — Emergency Department (HOSPITAL_COMMUNITY): Payer: 59

## 2018-08-27 ENCOUNTER — Other Ambulatory Visit: Payer: Self-pay

## 2018-08-27 DIAGNOSIS — Z7982 Long term (current) use of aspirin: Secondary | ICD-10-CM | POA: Insufficient documentation

## 2018-08-27 DIAGNOSIS — J45909 Unspecified asthma, uncomplicated: Secondary | ICD-10-CM | POA: Insufficient documentation

## 2018-08-27 DIAGNOSIS — R079 Chest pain, unspecified: Secondary | ICD-10-CM | POA: Diagnosis not present

## 2018-08-27 DIAGNOSIS — M62830 Muscle spasm of back: Secondary | ICD-10-CM | POA: Diagnosis not present

## 2018-08-27 DIAGNOSIS — Z87891 Personal history of nicotine dependence: Secondary | ICD-10-CM | POA: Diagnosis not present

## 2018-08-27 DIAGNOSIS — Z79899 Other long term (current) drug therapy: Secondary | ICD-10-CM | POA: Insufficient documentation

## 2018-08-27 MED ORDER — FAMOTIDINE 20 MG PO TABS: 20.0000 mg | ORAL_TABLET | Freq: Two times a day (BID) | ORAL | 0 refills | Status: DC

## 2018-08-27 MED ORDER — FAMOTIDINE 20 MG PO TABS
20.0000 mg | ORAL_TABLET | Freq: Once | ORAL | Status: AC
Start: 2018-08-27 — End: 2018-08-27
  Administered 2018-08-27: 20 mg via ORAL
  Filled 2018-08-27: qty 1

## 2018-08-27 MED ORDER — ALUM & MAG HYDROXIDE-SIMETH 200-200-20 MG/5ML PO SUSP
30.0000 mL | Freq: Once | ORAL | Status: AC
  Administered 2018-08-27: 30 mL via ORAL
  Filled 2018-08-27: qty 30

## 2018-08-27 MED ORDER — LIDOCAINE VISCOUS HCL 2 % MT SOLN
15.0000 mL | Freq: Once | OROMUCOSAL | Status: DC
  Filled 2018-08-27: qty 15

## 2018-08-27 MED ORDER — METHOCARBAMOL 500 MG PO TABS: 500.0000 mg | ORAL_TABLET | Freq: Two times a day (BID) | ORAL | 0 refills | Status: DC

## 2018-08-27 MED ORDER — METHOCARBAMOL 500 MG PO TABS
500.0000 mg | ORAL_TABLET | Freq: Once | ORAL | Status: AC
Start: 2018-08-27 — End: 2018-08-27
  Administered 2018-08-27: 500 mg via ORAL
  Filled 2018-08-27: qty 1

## 2018-08-27 NOTE — ED Triage Notes (Signed)
Pt presents for eval of back spasms that pt states are aggravating her hernia. Pt states she has an appointment on Friday with her primary care doctor, and was trying to wait but the pain has become unbearable. Pt states none of her home interventions alleviate the pain.

## 2018-08-27 NOTE — ED Notes (Signed)
Patient verbalizes understanding of discharge instructions. Opportunity for questioning and answers were provided. Armband removed by staff, pt discharged from ED.  

## 2018-08-27 NOTE — ED Provider Notes (Signed)
[facility] Provider Note   CSN: [id number] Arrival date & time: 08/27/18  1143     History   Chief Complaint Chief Complaint  Patient presents with  . Back Pain    HPI Marissa Benton is a 42 y.o. female with a past medical history of asthma, hiatal hernia who presents to ED for back spasms.  She was diagnosed with a hiatal hernia several months ago when she was living in [place].  She has since relocated to [place].  States that she is now having diffuse back spasms and reports a history of this.  States that this is worsening her reflux due to her hiatal hernia.  She has tried topical creams, Prilosec with no improvement in her symptoms.  Patient is unable to tell me anything in the past that has helped her symptoms improve.  Denies any injuries or falls, numbness in arms or legs, chest pain, shortness of breath, vomiting or urinary symptoms.  Patient states that "I have an appointment with my doctor this week but since I brought my mom here I thought I would get checked out too."  HPI  Past Medical History:  Diagnosis Date  . Asthma   . Stroke [facility](HCC)     Patient Active Problem List   Diagnosis Date Noted  . Asthma 01/13/2016  . Neck pain 01/13/2016  . Right cervical radiculopathy 01/13/2016  . 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 History of CVA (cerebrovascular accident) 01/13/2016  . Insomnia 01/13/2016    Past Surgical History:  Procedure Laterality Date  . DILATION AND CURETTAGE OF UTERUS       OB History   No obstetric history on file.      Home Medications    Prior to Admission medications   Medication Sig Start Date End Date Taking? Authorizing Provider  aspirin EC 81 MG tablet Take 81 mg by mouth daily as needed for mild pain.    [provider]  cyclobenzaprine (FLEXERIL) 5 MG tablet Take 1 tablet (5 mg total) by mouth every 8 (eight) hours as needed for muscle spasms. 01/13/16   Sater, Pearletha Furlichard A, MD    famotidine (PEPCID) 20 MG tablet Take 1 tablet (20 mg total) by mouth 2 (two) times daily. 08/27/18   Haily Caley, PA-C  gabapentin (NEURONTIN) 300 MG capsule One po qAM, one po qPM and two po qHS Patient taking differently: Take 300 mg by mouth See admin instructions. One po qAM, one po qPM and two po qHS 01/13/16   Sater, Pearletha Furlichard A, MD  methocarbamol (ROBAXIN) 500 MG tablet Take 1 tablet (500 mg total) by mouth 2 (two) times daily. 08/27/18   Yuette Putnam, PA-C  Multiple Vitamins-Calcium (ONE-A-DAY WOMENS FORMULA PO) Take 1 tablet by mouth every other day.    [provider]  omeprazole (PRILOSEC) 20 MG capsule Take 1 capsule (20 mg total) by mouth daily. 04/08/16   Mackuen, Courteney Lyn, MD  promethazine (PHENERGAN) 25 MG tablet Take 1 tablet (25 mg total) by mouth every 6 (six) hours as needed for nausea or vomiting. Patient not taking: Reported on 12/11/2015 09/14/15 01/01/16  Linwood DibblesKnapp, Jon, MD  ranitidine (ZANTAC) 150 MG tablet Take 1 tablet (150 mg total) by mouth 2 (two) times daily. Patient not taking: Reported on 09/14/2015 12/10/14 01/01/16  Joycie Peekartner, Benjamin, PA-C    Family History Family History  Problem Relation Age of Onset  . Healthy Mother   . Heart disease Father   . Kidney failure Father   .  Neuropathy Father     Social History Social History   Tobacco Use  . Smoking status: Former Games developer  . Smokeless tobacco: Never Used  Substance Use Topics  . Alcohol use: Yes    Comment: occasion  . Drug use: No     Allergies   Benadryl [diphenhydramine]   Review of Systems Review of Systems  Constitutional: Negative for chills and fever.  Cardiovascular: Negative for chest pain.  Gastrointestinal: Negative for nausea and vomiting.  Genitourinary: Negative for dysuria and flank pain.  Musculoskeletal: Positive for back pain.     Physical Exam Updated Vital Signs BP (!) 147/92   Pulse 85   Temp 98.5 F (36.9 C) (Oral)   Resp 14   SpO2 100%   Physical  Exam Vitals signs and nursing note reviewed.  Constitutional:      General: She is not in acute distress.    Appearance: She is well-developed. She is not diaphoretic.  HENT:     Head: Normocephalic and atraumatic.  Eyes:     General: No scleral icterus.    Conjunctiva/sclera: Conjunctivae normal.  Neck:     Musculoskeletal: Normal range of motion.  Cardiovascular:     Rate and Rhythm: Normal rate and regular rhythm.  Pulmonary:     Effort: Pulmonary effort is normal. No respiratory distress.     Breath sounds: Normal breath sounds.  Musculoskeletal:       Back:     Comments: No midline spinal tenderness present in lumbar, thoracic or cervical spine. No step-off palpated. No visible bruising, edema or temperature change noted. No objective signs of numbness present. No saddle anesthesia. 2+ DP pulses bilaterally. Sensation intact to light touch. Strength 5/5 in bilateral lower extremities.   Skin:    Findings: No rash.  Neurological:     Mental Status: She is alert.      ED Treatments / Results  Labs (all labs ordered are listed, but only abnormal results are displayed) Labs Reviewed - No data to display  EKG None  Radiology Dg Chest 2 View  Result Date: 08/27/2018 CLINICAL DATA:  back spasms that pt states are aggravating her hernia. Pt states she has an appointment on Friday with her primary care doctor, and was trying to wait but the pain has become unbearable. Pt states none of her home interventions alleviate the pain. EXAM: CHEST - 2 VIEW COMPARISON:  04/08/2016 FINDINGS: Lungs are clear. Heart size and mediastinal contours are within normal limits. No effusion. Visualized bones unremarkable. IMPRESSION: No acute cardiopulmonary disease. Electronically Signed   By: Corlis Leak M.D.   On: 08/27/2018 13:41    Procedures Procedures (including critical care time)  Medications Ordered in ED Medications  alum & mag hydroxide-simeth (MAALOX/MYLANTA) 200-200-20 MG/5ML  suspension 30 mL (30 mLs Oral Given 08/27/18 1253)    And  lidocaine (XYLOCAINE) 2 % viscous mouth solution 15 mL (15 mLs Oral Refused 08/27/18 1256)  famotidine (PEPCID) tablet 20 mg (20 mg Oral Given 08/27/18 1253)  methocarbamol (ROBAXIN) tablet 500 mg (500 mg Oral Given 08/27/18 1253)     Initial Impression / Assessment and Plan / ED Course  I have reviewed the triage vital signs and the nursing notes.  Pertinent labs & imaging results that were available during my care of the patient were reviewed by me and considered in my medical decision making (see chart for details).     42 year old female presents to ED for back spasms.  States that they are  located diffusely throughout her back.  This is making her indigestion from her hernia worse.  Improvement here with GI cocktail, Robaxin and Pepcid.  Chest x-ray is negative. Patient denies any concerning symptoms suggestive of cauda equina requiring urgent imaging at this time such as loss of sensation in the lower extremities, lower extremity weakness, loss of bowel or bladder control, saddle anesthesia, urinary retention, fever/chills, IVDU. Exam demonstrated no  weakness on exam today. No preceding injury or trauma to suggest acute fracture. Doubt pelvic or urinary pathology for patient's acute back pain, as patient denies urinary symptoms. Doubt AAA as cause of patient's back pain as patient lacks major risk factors, had no abdominal TTP, and has symmetric and intact distal pulses. Patient given strict return precautions for any symptoms indicating worsening neurologic function in the lower extremities.  Patient is hemodynamically stable, in NAD, and able to ambulate in the ED. Evaluation does not show pathology that would require ongoing emergent intervention or inpatient treatment. I explained the diagnosis to the patient. Pain has been managed and has no complaints prior to discharge. Patient is comfortable with above plan and is stable for  discharge at this time. All questions were answered prior to disposition. Strict return precautions for returning to the ED were discussed. Encouraged follow up with PCP.    Portions of this note were generated with Scientist, clinical (histocompatibility and immunogenetics)Dragon dictation software. Dictation errors may occur despite best attempts at proofreading.  Final Clinical Impressions(s) / ED Diagnoses   Final diagnoses:  Muscle spasm of back    ED Discharge Orders         Ordered    famotidine (PEPCID) 20 MG tablet  2 times daily     08/27/18 1409    methocarbamol (ROBAXIN) 500 MG tablet  2 times daily     08/27/18 1409           Dietrich PatesKhatri, Yaasir Menken, PA-C 08/27/18 1410    Gwyneth SproutPlunkett, Whitney, MD 08/28/18 1310

## 2018-09-02 ENCOUNTER — Encounter: Payer: Self-pay | Admitting: Family Medicine

## 2018-09-02 ENCOUNTER — Ambulatory Visit (INDEPENDENT_AMBULATORY_CARE_PROVIDER_SITE_OTHER): Payer: 59 | Admitting: Family Medicine

## 2018-09-02 VITALS — BP 142/91 | HR 75 | Resp 17 | Ht 64.0 in | Wt 198.8 lb

## 2018-09-02 DIAGNOSIS — R1013 Epigastric pain: Secondary | ICD-10-CM

## 2018-09-02 DIAGNOSIS — M5410 Radiculopathy, site unspecified: Secondary | ICD-10-CM

## 2018-09-02 DIAGNOSIS — Z716 Tobacco abuse counseling: Secondary | ICD-10-CM

## 2018-09-02 DIAGNOSIS — M62838 Other muscle spasm: Secondary | ICD-10-CM

## 2018-09-02 DIAGNOSIS — R1906 Epigastric swelling, mass or lump: Secondary | ICD-10-CM

## 2018-09-02 DIAGNOSIS — K429 Umbilical hernia without obstruction or gangrene: Secondary | ICD-10-CM

## 2018-09-02 DIAGNOSIS — J45909 Unspecified asthma, uncomplicated: Secondary | ICD-10-CM

## 2018-09-02 DIAGNOSIS — K449 Diaphragmatic hernia without obstruction or gangrene: Secondary | ICD-10-CM

## 2018-09-02 DIAGNOSIS — Z7689 Persons encountering health services in other specified circumstances: Secondary | ICD-10-CM

## 2018-09-02 DIAGNOSIS — K219 Gastro-esophageal reflux disease without esophagitis: Secondary | ICD-10-CM

## 2018-09-02 MED ORDER — BUPROPION HCL ER (SR) 150 MG PO TB12: 150.0000 mg | ORAL_TABLET | Freq: Two times a day (BID) | ORAL | 0 refills | Status: DC

## 2018-09-02 MED ORDER — PANTOPRAZOLE SODIUM 40 MG PO TBEC: 40.0000 mg | DELAYED_RELEASE_TABLET | Freq: Every day | ORAL | 3 refills | Status: DC

## 2018-09-02 MED ORDER — CYCLOBENZAPRINE HCL 5 MG PO TABS: 5.0000 mg | ORAL_TABLET | Freq: Three times a day (TID) | ORAL | 1 refills | Status: DC | PRN

## 2018-09-02 MED ORDER — ALBUTEROL SULFATE HFA 108 (90 BASE) MCG/ACT IN AERS: 1.0000 | INHALATION_SPRAY | Freq: Four times a day (QID) | RESPIRATORY_TRACT | 2 refills | Status: DC | PRN

## 2018-09-02 MED ORDER — ONDANSETRON HCL 4 MG PO TABS: 4.0000 mg | ORAL_TABLET | Freq: Three times a day (TID) | ORAL | 1 refills | Status: DC | PRN

## 2018-09-02 NOTE — Progress Notes (Addendum)
Approval # [id number]. Valid 09/02/18-10/17/18.  CT scheduled for 2:45 pm on 09/07/2018 at [facility]. NPO for 4 hrs prior to exam.  Patient called & notified of appointment information & instructions.

## 2018-09-02 NOTE — Progress Notes (Addendum)
Marissa Benton, is a 43 y.o. female  ZDG:[id number]  [id number]  DOB - November 08, 1975  CC:  Chief Complaint  Patient presents with  . Establish Care  . Follow-up    ED 12/28: muscle spasms. has noticed improvement with muscle relaxers  . Hiatal Hernia    states that she has been having nausea, bloating & pain recently       HPI: Marissa Benton is a 43 y.o. female is here today to establish care and evaluation of muscle spasms   Marissa Benton has Asthma; Neck pain; Right cervical radiculopathy; Bilateral sciatica; History of CVA (cerebrovascular accident); and Insomnia on their problem list. Patient presents today for an ED follow-up.   Generalized Spasmatic Pain Patient reports a longstanding history of generalized muscle and body spasms. She has a history of a stroke like event. Body spasms have been going on for the last couple of years. She has had an autoimmune and rheumatoid work-up which was negative at her previous primary care provider in [place]. Complains of generalized aching when spasms occur. No prior nerve conduction test work. Pain ranges from severe to moderate. In 2018 she was followed by neurology and an MRI was recommended, although she never completed the testing.   Hiatal Hernia/Mid-epigastric pain  A second concern is that of a possible hiatal hernia in her mid epigastric region. She reports constant pain which is worsened by intake of food in her mid epigastric region. She reports a CT scan in the past which confirmed the presence of the hernia. In review of the EMR, last CT of Abdomen was significant for moderate and severe diverticulosis and diverticulitis. She is not experiencing LLQ or RLQ pain. She endorses a history of GERD which she has taken dietary measures and stopped smoking to control symptoms of GERD. She is currently taking Pepcid intermittently. No recent episodes of nausea. Bowel movement fluctuate between hard and soft. No history of IBS.   Smoking  Cessation Stopped smoking 2 weeks ago. Continues to have terrible cravings and urges to smoke.  She is currently not taking any medication or using patches. She stopped cold [place].  Would like something to help reduce cravings.    Current medications: Current Outpatient Medications:  .  famotidine (PEPCID) 20 MG tablet, Take 1 tablet (20 mg total) by mouth 2 (two) times daily., Disp: 30 tablet, Rfl: 0 .  methocarbamol (ROBAXIN) 500 MG tablet, Take 1 tablet (500 mg total) by mouth 2 (two) times daily., Disp: 20 tablet, Rfl: 0 .  albuterol (PROVENTIL HFA;VENTOLIN HFA) 108 (90 Base) MCG/ACT inhaler, Inhale 1-2 puffs into the lungs every 6 (six) hours as needed for wheezing or shortness of breath., Disp: 1 Inhaler, Rfl: 2 .  buPROPion (WELLBUTRIN SR) 150 MG 12 hr tablet, Take 1 tablet (150 mg total) by mouth 2 (two) times daily., Disp: 90 tablet, Rfl: 0 .  cyclobenzaprine (FLEXERIL) 5 MG tablet, Take 1-2 tablets (5-10 mg total) by mouth 3 (three) times daily as needed for muscle spasms., Disp: 60 tablet, Rfl: 1 .  ondansetron (ZOFRAN) 4 MG tablet, Take 1 tablet (4 mg total) by mouth every 8 (eight) hours as needed for nausea or vomiting., Disp: 30 tablet, Rfl: 1 .  pantoprazole (PROTONIX) 40 MG tablet, Take 1 tablet (40 mg total) by mouth daily., Disp: 30 tablet, Rfl: 3   Pertinent family medical history: family history includes Healthy in her mother; Heart disease in her father; Kidney failure in her father; Neuropathy in her father.   Allergies  Allergen Reactions  . Benadryl [Diphenhydramine] Hives    Benadryl IV.  Can take PO Benadryl   . Iodinated Diagnostic Agents     Social History   Socioeconomic History  . Marital status: Married    Spouse name: Not on file  . Number of children: Not on file  . Years of education: Not on file  . Highest education level: Not on file  Occupational History  . Not on file  Social Needs  . Financial resource strain: Not on file  . Food  insecurity:    Worry: Not on file    Inability: Not on file  . Transportation needs:    Medical: Not on file    Non-medical: Not on file  Tobacco Use  . Smoking status: Former Games developer  . Smokeless tobacco: Never Used  Substance and Sexual Activity  . Alcohol use: Yes    Comment: occasion  . Drug use: No  . Sexual activity: Not on file  Lifestyle  . Physical activity:    Days per week: Not on file    Minutes per session: Not on file  . Stress: Not on file  Relationships  . Social connections:    Talks on phone: Not on file    Gets together: Not on file    Attends religious service: Not on file    Active member of club or organization: Not on file    Attends meetings of clubs or organizations: Not on file    Relationship status: Not on file  . Intimate partner violence:    Fear of current or ex partner: Not on file    Emotionally abused: Not on file    Physically abused: Not on file    Forced sexual activity: Not on file  Other Topics Concern  . Not on file  Social History Narrative  . Not on file    Review of Systems: Constitutional: Negative for fever, chills, diaphoresis, activity change, appetite change and fatigue. HENT: Negative for ear pain, nosebleeds, congestion, facial swelling, rhinorrhea, neck pain, neck stiffness and ear discharge.  Eyes: Negative for pain, discharge, redness, itching and visual disturbance. Respiratory: Negative for cough, choking, chest tightness, shortness of breath, wheezing and stridor.  Cardiovascular: Negative for chest pain, palpitations and leg swelling. Gastrointestinal: See HPI  Genitourinary: Negative for dysuria, urgency, frequency, hematuria, flank pain, decreased urine volume, difficulty urinating. Musculoskeletal: Negative for back pain, joint swelling, or gait problems. Positive myalgias Neurological: Negative for dizziness, tremors, seizures, syncope, facial asymmetry, speech difficulty, weakness, light-headedness, numbness  and headaches.  Positive for generalized spasms Hematological: Negative for adenopathy. Does not bruise/bleed easily. Psychiatric/Behavioral: Negative for hallucinations, behavioral problems, confusion, dysphoric mood, decreased concentration and agitation.    Objective:   Vitals:   09/02/18 0852  BP: (!) 142/91  Pulse: 75  Resp: 17  SpO2: 97%    BP Readings from Last 3 Encounters:  09/02/18 (!) 142/91  08/27/18 122/85  12/08/16 119/72    Filed Weights   09/02/18 0852  Weight: 198 lb 12.8 oz (90.2 kg)      Physical Exam: Constitutional: Patient appears well-developed and well-nourished. No distress. HENT: Normocephalic, atraumatic, External right and left ear normal. Oropharynx is clear and moist.  Eyes: Conjunctivae and EOM are normal. PERRLA, no scleral icterus. Neck: Normal ROM. Neck supple. No JVD. No tracheal deviation. No thyromegaly. CVS: RRR, S1/S2 +, no murmurs, no gallops, no carotid bruit.  Pulmonary: Effort and breath sounds normal, no stridor, rhonchi, wheezes, rales.  Abdominal: Soft. BS +, no distension, tenderness, rebound or guarding.  Musculoskeletal: Normal range of motion. No edema and no generalized tenderness with palpation of extremities Neuro: Alert. Normal muscle tone coordination. Normal gait. BUE and BLE strength 5/5. Bilateral hand grips symmetrical. Speech pattern periods slurring (possible baseline given hx of CVA)  Skin: Skin is warm and dry. No rash noted. Not diaphoretic. No erythema. No pallor. Psychiatric: Normal mood and affect. Behavior, judgment, thought content normal.  Lab Results (prior encounters)  Lab Results  Component Value Date   WBC 11.1 (H) 04/08/2016   HGB 14.6 04/08/2016   HCT 44.8 04/08/2016   MCV 98.7 04/08/2016   PLT 346 04/08/2016   Lab Results  Component Value Date   CREATININE 1.09 (H) 04/08/2016   BUN 5 (L) 04/08/2016   NA 141 04/08/2016   K 3.8 04/08/2016   CL 106 04/08/2016   CO2 22 04/08/2016        Assessment and plan:  1. Encounter to establish care 2. Spasm of muscle 3. Radicular neuropathy Patient would benefit from nerve conduction study as recent work -up for autoimmune source yielded normal results. Referring to Neurology for further work-up and evaluation.  4. Uncomplicated asthma, unspecified asthma severity, unspecified whether persistent -Refilled albuterol, asymptomatic   5. Encounter for smoking cessation counseling -For craving management will trial Wellbutrin   6. Hiatal hernia vs abdominal mass Uncertain of who made diagnosis, however there is a palpable mass-like nodule mid-epigastric region. Will obtain a CT of abdomen to visualize mass - Comprehensive metabolic panel for CT as contrast is required Orders Placed This Encounter  Procedures  . CT ABDOMEN LIMITED WO CONTRAST  . Comprehensive metabolic panel  . Ambulatory referral to Neurology   Meds ordered this encounter  Medications  . albuterol (PROVENTIL HFA;VENTOLIN HFA) 108 (90 Base) MCG/ACT inhaler    Sig: Inhale 1-2 puffs into the lungs every 6 (six) hours as needed for wheezing or shortness of breath.    Dispense:  1 Inhaler    Refill:  2  . pantoprazole (PROTONIX) 40 MG tablet    Sig: Take 1 tablet (40 mg total) by mouth daily.    Dispense:  30 tablet    Refill:  3  . buPROPion (WELLBUTRIN SR) 150 MG 12 hr tablet    Sig: Take 1 tablet (150 mg total) by mouth 2 (two) times daily.    Dispense:  90 tablet    Refill:  0  . cyclobenzaprine (FLEXERIL) 5 MG tablet    Sig: Take 1-2 tablets (5-10 mg total) by mouth 3 (three) times daily as needed for muscle spasms.    Dispense:  60 tablet    Refill:  1  . ondansetron (ZOFRAN) 4 MG tablet    Sig: Take 1 tablet (4 mg total) by mouth every 8 (eight) hours as needed for nausea or vomiting.    Dispense:  30 tablet    Refill:  1     6 months Complere Physical Exam.  The patient was given clear instructions to go to ER or return to medical center if  symptoms don't improve, worsen or new problems develop. The patient verbalized understanding. The patient was advised  to call and obtain lab results if they haven't heard anything from out office within 7-10 business days.   Joaquin Courts, FNP Primary Care at [facility] [address], [place] [address] [phone]: [phone]    This note has been created with Dragon speech recognition software and  smart Lobbyist. Any transcriptional errors are unintentional.

## 2018-09-02 NOTE — Patient Instructions (Addendum)
Thank you for choosing [facility] Care at [facility] to be your medical home!    Marissa Benton was seen by Joaquin Courts, FNP today.   Marissa Benton's primary care provider is Patient, No Pcp Per.   For the best care possible, you should try to see Joaquin Courts, FNP-C whenever you come to the clinic.   We look forward to seeing you again soon!  If you have any questions about your visit today, please call us at [phone] or feel free to reach your primary care provider via MyChart.       Hiatal Hernia  A hiatal hernia occurs when part of the stomach slides above the muscle that separates the abdomen from the chest (diaphragm). A person can be born with a hiatal hernia (congenital), or it may develop over time. In almost all cases of hiatal hernia, only the top part of the stomach pushes through the diaphragm. Many people have a hiatal hernia with no symptoms. The larger the hernia, the more likely it is that you will have symptoms. In some cases, a hiatal hernia allows stomach acid to flow back into the tube that carries food from your mouth to your stomach (esophagus). This may cause heartburn symptoms. Severe heartburn symptoms may mean that you have developed a condition called gastroesophageal reflux disease (GERD). What are the causes? This condition is caused by a weakness in the opening (hiatus) where the esophagus passes through the diaphragm to attach to the upper part of the stomach. A person may be born with a weakness in the hiatus, or a weakness can develop over time. What increases the risk? This condition is more likely to develop in:  Older people. Age is a major risk factor for a hiatal hernia, especially if you are over the age of 91.  Pregnant women.  People who are overweight.  People who have frequent constipation. What are the signs or symptoms? Symptoms of this condition usually develop in the form of GERD symptoms. Symptoms  include:  Heartburn.  Belching.  Indigestion.  Trouble swallowing.  Coughing or wheezing.  Sore throat.  Hoarseness.  Chest pain.  Nausea and vomiting. How is this diagnosed? This condition may be diagnosed during testing for GERD. Tests that may be done include:  X-rays of your stomach or chest.  An upper gastrointestinal (GI) series. This is an X-ray exam of your GI tract that is taken after you swallow a chalky liquid that shows up clearly on the X-ray.  Endoscopy. This is a procedure to look into your stomach using a thin, flexible tube that has a tiny camera and light on the end of it. How is this treated? This condition may be treated by:  Dietary and lifestyle changes to help reduce GERD symptoms.  Medicines. These may include: ? Over-the-counter antacids. ? Medicines that make your stomach empty more quickly. ? Medicines that block the production of stomach acid (H2 blockers). ? Stronger medicines to reduce stomach acid (proton pump inhibitors).  Surgery to repair the hernia, if other treatments are not helping. If you have no symptoms, you may not need treatment. Follow these instructions at home: Lifestyle and activity  Do not use any products that contain nicotine or tobacco, such as cigarettes and e-cigarettes. If you need help quitting, ask your health care provider.  Try to achieve and maintain a healthy body weight.  Avoid putting pressure on your abdomen. Anything that puts pressure on your abdomen increases the amount of acid that  may be pushed up into your esophagus. ? Avoid bending over, especially after eating. ? Raise the head of your bed by putting blocks under the legs. This keeps your head and esophagus higher than your stomach. ? Do not wear tight clothing around your chest or stomach. ? Try not to strain when having a bowel movement, when urinating, or when lifting heavy objects. Eating and drinking  Avoid foods that can worsen GERD  symptoms. These may include: ? Fatty foods, like fried foods. ? Citrus fruits, like oranges or lemon. ? Other foods and drinks that contain acid, like orange juice or tomatoes. ? Spicy food. ? Chocolate.  Eat frequent small meals instead of three large meals a day. This helps prevent your stomach from getting too full. ? Eat slowly. ? Do not lie down right after eating. ? Do not eat 1-2 hours before bed.  Do not drink beverages with caffeine. These include cola, coffee, cocoa, and tea.  Do not drink alcohol. General instructions  Take over-the-counter and prescription medicines only as told by your health care provider.  Keep all follow-up visits as told by your health care provider. This is important. Contact a health care provider if:  Your symptoms are not controlled with medicines or lifestyle changes.  You are having trouble swallowing.  You have coughing or wheezing that will not go away. Get help right away if:  Your pain is getting worse.  Your pain spreads to your arms, neck, jaw, teeth, or back.  You have shortness of breath.  You sweat for no reason.  You feel sick to your stomach (nauseous) or you vomit.  You vomit blood.  You have bright red blood in your stools.  You have black, tarry stools. This information is not intended to replace advice given to you by your health care provider. Make sure you discuss any questions you have with your health care provider. Document Released: 11/07/2003 Document Revised: 03/22/2017 Document Reviewed: 03/22/2017 Elsevier Interactive Patient Education  2019 [employer].

## 2018-09-03 LAB — COMPREHENSIVE METABOLIC PANEL
A/G RATIO: 1.5 (ref 1.2–2.2)
ALT: 13 IU/L (ref 0–32)
AST: 14 IU/L (ref 0–40)
Albumin: 4.3 g/dL (ref 3.5–5.5)
Alkaline Phosphatase: 80 IU/L (ref 39–117)
BUN/Creatinine Ratio: 18 (ref 9–23)
BUN: 11 mg/dL (ref 6–24)
Bilirubin Total: 0.5 mg/dL (ref 0.0–1.2)
CALCIUM: 9.3 mg/dL (ref 8.7–10.2)
CHLORIDE: 102 mmol/L (ref 96–106)
CO2: 20 mmol/L (ref 20–29)
CREATININE: 0.62 mg/dL (ref 0.57–1.00)
GFR calc Af Amer: 130 mL/min/{1.73_m2} (ref >59)
GFR, EST NON AFRICAN AMERICAN: 112 mL/min/{1.73_m2} (ref >59)
GLUCOSE: 101 mg/dL — AB (ref 65–99)
Globulin, Total: 2.8 g/dL (ref 1.5–4.5)
POTASSIUM: 4.2 mmol/L (ref 3.5–5.2)
SODIUM: 136 mmol/L (ref 134–144)
TOTAL PROTEIN: 7.1 g/dL (ref 6.0–8.5)

## 2018-09-05 ENCOUNTER — Telehealth: Payer: Self-pay | Admitting: Family Medicine

## 2018-09-05 NOTE — Telephone Encounter (Signed)
Please contact [facility] to see if they will add a B12 and A1C to labs

## 2018-09-07 ENCOUNTER — Ambulatory Visit (HOSPITAL_COMMUNITY)
Admission: RE | Admit: 2018-09-07 | Discharge: 2018-09-07 | Disposition: A | Discharge status: Home / Self Care | Payer: 59 | Source: Ambulatory Visit | Attending: Family Medicine | Admitting: Family Medicine

## 2018-09-07 ENCOUNTER — Encounter: Payer: Self-pay | Admitting: Family Medicine

## 2018-09-07 DIAGNOSIS — R1013 Epigastric pain: Secondary | ICD-10-CM | POA: Insufficient documentation

## 2018-09-07 DIAGNOSIS — K429 Umbilical hernia without obstruction or gangrene: Secondary | ICD-10-CM | POA: Diagnosis not present

## 2018-09-07 NOTE — Telephone Encounter (Signed)
Unable to add labs on.

## 2018-09-09 ENCOUNTER — Telehealth: Payer: Self-pay | Admitting: Family Medicine

## 2018-09-09 ENCOUNTER — Encounter: Payer: Self-pay | Admitting: Family Medicine

## 2018-09-09 NOTE — Addendum Note (Signed)
Addended by: Bing Neighbors on: 09/09/2018 07:09 AM   Modules accepted: Orders

## 2018-09-09 NOTE — Telephone Encounter (Signed)
Please fax referral and note to [facility]. My Chart message sent notifying patient of CT results

## 2018-09-09 NOTE — Telephone Encounter (Signed)
Referral packet faxed to [phone])

## 2018-09-28 ENCOUNTER — Encounter (HOSPITAL_COMMUNITY): Payer: Self-pay

## 2018-09-28 ENCOUNTER — Emergency Department (HOSPITAL_COMMUNITY)
Admission: EM | Admit: 2018-09-28 | Discharge: 2018-09-28 | Disposition: A | Discharge status: Home / Self Care | Payer: 59 | Attending: Emergency Medicine | Admitting: Emergency Medicine

## 2018-09-28 ENCOUNTER — Other Ambulatory Visit: Payer: Self-pay

## 2018-09-28 DIAGNOSIS — K0889 Other specified disorders of teeth and supporting structures: Secondary | ICD-10-CM | POA: Insufficient documentation

## 2018-09-28 DIAGNOSIS — K449 Diaphragmatic hernia without obstruction or gangrene: Secondary | ICD-10-CM | POA: Diagnosis not present

## 2018-09-28 DIAGNOSIS — K429 Umbilical hernia without obstruction or gangrene: Secondary | ICD-10-CM | POA: Diagnosis not present

## 2018-09-28 DIAGNOSIS — R12 Heartburn: Secondary | ICD-10-CM | POA: Diagnosis not present

## 2018-09-28 DIAGNOSIS — Z87891 Personal history of nicotine dependence: Secondary | ICD-10-CM | POA: Diagnosis not present

## 2018-09-28 DIAGNOSIS — Z79899 Other long term (current) drug therapy: Secondary | ICD-10-CM | POA: Diagnosis not present

## 2018-09-28 DIAGNOSIS — J45909 Unspecified asthma, uncomplicated: Secondary | ICD-10-CM | POA: Insufficient documentation

## 2018-09-28 MED ORDER — AMOXICILLIN 500 MG PO CAPS
500.0000 mg | ORAL_CAPSULE | Freq: Once | ORAL | Status: AC
Start: 2018-09-28 — End: 2018-09-28
  Administered 2018-09-28: 500 mg via ORAL
  Filled 2018-09-28: qty 1

## 2018-09-28 MED ORDER — IBUPROFEN 400 MG PO TABS
600.0000 mg | ORAL_TABLET | Freq: Once | ORAL | Status: AC
  Administered 2018-09-28: 600 mg via ORAL
  Filled 2018-09-28: qty 1

## 2018-09-28 MED ORDER — AMOXICILLIN 500 MG PO CAPS
500.0000 mg | ORAL_CAPSULE | Freq: Three times a day (TID) | ORAL | 0 refills | Status: DC
Start: 2018-09-28 — End: 2019-03-01

## 2018-09-28 NOTE — ED Notes (Signed)
Got patient into a gown on the monitor patient is resting with call bell in reach and family at bedside ?

## 2018-09-28 NOTE — ED Provider Notes (Signed)
[facility] EMERGENCY DEPARTMENT Provider Note   CSN: [id number] Arrival date & time: 09/28/18  4132     History   Chief Complaint Chief Complaint  Patient presents with  . Dental Pain    HPI Marissa Benton is a 43 y.o. female.  43 year old female with prior medical history as detailed below presents for evaluation of left-sided jaw swelling and pain.  Patient reports longstanding history of dental decay and dental disease.  Patient reports onset of swelling to the left jaw over the last 2 days.  She denies fever.  She denies trouble swallowing.  She denies change in phonation.  She is taken ibuprofen with minimal relief in her symptoms.  She has not yet seen a dentist about her dental care.  The history is provided by the patient and medical records.  Dental Pain  Location:  Lower Lower teeth location:  17/LL 3rd molar, 18/LL 2nd molar and 19/LL 1st molar Quality:  Aching Severity:  Mild Onset quality:  Gradual Duration:  2 days Timing:  Constant Progression:  Worsening Chronicity:  New Context: dental caries   Context: not abscess   Relieved by:  Nothing Worsened by:  Nothing Ineffective treatments:  NSAIDs Associated symptoms: no fever     Past Medical History:  Diagnosis Date  . Asthma   . Stroke [facility])     Patient Active Problem List   Diagnosis Date Noted  . Asthma 01/13/2016  . Neck pain 01/13/2016  . Right cervical radiculopathy 01/13/2016  . 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 History of CVA (cerebrovascular accident) 01/13/2016  . Insomnia 01/13/2016    Past Surgical History:  Procedure Laterality Date  . DILATION AND CURETTAGE OF UTERUS       OB History   No obstetric history on file.      Home Medications    Prior to Admission medications   Medication Sig Start Date End Date Taking? Authorizing Provider  albuterol (PROVENTIL HFA;VENTOLIN HFA) 108 (90 Base) MCG/ACT inhaler Inhale 1-2 puffs into the lungs every 6 (six)  hours as needed for wheezing or shortness of breath. 09/02/18   Bing Neighbors, FNP  amoxicillin (AMOXIL) 500 MG capsule Take 1 capsule (500 mg total) by mouth 3 (three) times daily. 09/28/18   Wynetta Fines, MD  buPROPion (WELLBUTRIN SR) 150 MG 12 hr tablet Take 1 tablet (150 mg total) by mouth 2 (two) times daily. 09/02/18   Bing Neighbors, FNP  cyclobenzaprine (FLEXERIL) 5 MG tablet Take 1-2 tablets (5-10 mg total) by mouth 3 (three) times daily as needed for muscle spasms. 09/02/18   Bing Neighbors, FNP  famotidine (PEPCID) 20 MG tablet Take 1 tablet (20 mg total) by mouth 2 (two) times daily. 08/27/18   Khatri, Hina, PA-C  methocarbamol (ROBAXIN) 500 MG tablet Take 1 tablet (500 mg total) by mouth 2 (two) times daily. 08/27/18   Khatri, Hina, PA-C  ondansetron (ZOFRAN) 4 MG tablet Take 1 tablet (4 mg total) by mouth every 8 (eight) hours as needed for nausea or vomiting. 09/02/18   Bing Neighbors, FNP  pantoprazole (PROTONIX) 40 MG tablet Take 1 tablet (40 mg total) by mouth daily. 09/02/18   Bing Neighbors, FNP    Family History Family History  Problem Relation Age of Onset  . Healthy Mother   . Heart disease Father   . Kidney failure Father   . Neuropathy Father     Social History Social History   Tobacco Use  .  Smoking status: Former Games developer  . Smokeless tobacco: Never Used  Substance Use Topics  . Alcohol use: Yes    Comment: occasion  . Drug use: No     Allergies   Benadryl [diphenhydramine] and Iodinated diagnostic agents   Review of Systems Review of Systems  Constitutional: Negative for fever.  All other systems reviewed and are negative.    Physical Exam Updated Vital Signs BP (!) 153/101 (BP Location: Right Arm)   Pulse 96   Temp 98 F (36.7 C) (Oral)   Resp 20   Ht 5\' 2"  (1.575 m)   Wt 90.3 kg   LMP 09/27/2018   SpO2 100%   BMI 36.40 kg/m   Physical Exam Vitals signs and nursing note reviewed.  Constitutional:      General: She is  not in acute distress.    Appearance: She is well-developed.  HENT:     Head: Normocephalic and atraumatic.     Mouth/Throat:     Comments: Diffuse dental caries.  Significant dental decay to the left lower molars.  No dental abscess noted. Eyes:     Conjunctiva/sclera: Conjunctivae normal.     Pupils: Pupils are equal, round, and reactive to light.  Neck:     Musculoskeletal: Normal range of motion and neck supple.  Cardiovascular:     Rate and Rhythm: Normal rate and regular rhythm.     Heart sounds: Normal heart sounds.  Pulmonary:     Effort: Pulmonary effort is normal. No respiratory distress.     Breath sounds: Normal breath sounds.  Abdominal:     General: There is no distension.     Palpations: Abdomen is soft.     Tenderness: There is no abdominal tenderness.  Musculoskeletal: Normal range of motion.        General: No deformity.  Skin:    General: Skin is warm and dry.  Neurological:     Mental Status: She is alert and oriented to person, place, and time.      ED Treatments / Results  Labs (all labs ordered are listed, but only abnormal results are displayed) Labs Reviewed - No data to display  EKG None  Radiology No results found.  Procedures Procedures (including critical care time)  Medications Ordered in ED Medications  ibuprofen (ADVIL,MOTRIN) tablet 600 mg (has no administration in time range)  amoxicillin (AMOXIL) capsule 500 mg (has no administration in time range)     Initial Impression / Assessment and Plan / ED Course  I have reviewed the triage vital signs and the nursing notes.  Pertinent labs & imaging results that were available during my care of the patient were reviewed by me and considered in my medical decision making (see chart for details).     MDM  Screen complete  Patient is presenting for evaluation of dental pain.  Patient with likely dental infection.  No evidence of dental abscess found on exam.  Patient given  prescription for antibiotics.  She does understand the need for close follow-up with dentistry for definitive treatment.  Return precautions given and understood.  Importance of close follow-up is repeatedly stressed.  Final Clinical Impressions(s) / ED Diagnoses   Final diagnoses:  Pain, dental    ED Discharge Orders         Ordered    amoxicillin (AMOXIL) 500 MG capsule  3 times daily     09/28/18 0934           Wynetta Fines, MD  09/28/18 0938  

## 2018-09-28 NOTE — ED Triage Notes (Signed)
Pt reports swelling and pain to left lower teeth for two days pain secondary to broken tooth.

## 2018-09-28 NOTE — Discharge Instructions (Addendum)
Return for any problem.  Follow-up with your regular care provider as instructed.  It is imperative that you see a dentist.  Your teeth require definitive treatment.  This cannot be done in the ED.

## 2018-10-12 DIAGNOSIS — R1013 Epigastric pain: Secondary | ICD-10-CM | POA: Diagnosis not present

## 2018-11-06 ENCOUNTER — Other Ambulatory Visit: Payer: Self-pay

## 2018-11-06 ENCOUNTER — Emergency Department (HOSPITAL_COMMUNITY)
Admission: EM | Admit: 2018-11-06 | Discharge: 2018-11-06 | Disposition: A | Discharge status: Home / Self Care | Payer: 59 | Attending: Emergency Medicine | Admitting: Emergency Medicine

## 2018-11-06 DIAGNOSIS — Z87891 Personal history of nicotine dependence: Secondary | ICD-10-CM | POA: Insufficient documentation

## 2018-11-06 DIAGNOSIS — R202 Paresthesia of skin: Secondary | ICD-10-CM | POA: Insufficient documentation

## 2018-11-06 DIAGNOSIS — M546 Pain in thoracic spine: Secondary | ICD-10-CM

## 2018-11-06 DIAGNOSIS — J45909 Unspecified asthma, uncomplicated: Secondary | ICD-10-CM | POA: Diagnosis not present

## 2018-11-06 DIAGNOSIS — Z79899 Other long term (current) drug therapy: Secondary | ICD-10-CM | POA: Diagnosis not present

## 2018-11-06 MED ORDER — METHOCARBAMOL 500 MG PO TABS: 500.0000 mg | ORAL_TABLET | Freq: Two times a day (BID) | ORAL | 0 refills | Status: DC

## 2018-11-06 MED ORDER — PREDNISONE 50 MG PO TABS: 50.0000 mg | ORAL_TABLET | Freq: Every day | ORAL | 0 refills | Status: DC

## 2018-11-06 NOTE — ED Triage Notes (Signed)
Pt having worsening back pain that radiates from neck to mid back. Also tingling in her legs. Back pain is 7/10

## 2018-11-06 NOTE — ED Provider Notes (Signed)
[facility] EMERGENCY DEPARTMENT Provider Note   CSN: [id number] Arrival date & time: 11/06/18  1414    History   Chief Complaint Chief Complaint  Patient presents with  . Back Pain    HPI Marissa Benton is a 43 y.o. female.   HPI The patient presents to the emergency room for evaluation of back pain.  Patient does have a history of prior back pain problems including cervical radiculopathy and sciatica.  Patient states she was at church today when she started having pain in her back.  Sharp pain.  Certain positions and movement exacerbate the pain.  Denies any recent falls or injuries.  She tried taking a Tylenol with only minimal relief.  Patient felt some tingling in her legs.. She denies any weakness or numbness.  She is not have any difficulty walking. Past Medical History:  Diagnosis Date  . Asthma   . Stroke [facility])     Patient Active Problem List   Diagnosis Date Noted  . Asthma 01/13/2016  . Neck pain 01/13/2016  . Right cervical radiculopathy 01/13/2016  . 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 History of CVA (cerebrovascular accident) 01/13/2016  . Insomnia 01/13/2016    Past Surgical History:  Procedure Laterality Date  . DILATION AND CURETTAGE OF UTERUS       OB History   No obstetric history on file.      Home Medications    Prior to Admission medications   Medication Sig Start Date End Date Taking? Authorizing Provider  albuterol (PROVENTIL HFA;VENTOLIN HFA) 108 (90 Base) MCG/ACT inhaler Inhale 1-2 puffs into the lungs every 6 (six) hours as needed for wheezing or shortness of breath. 09/02/18   Bing Neighbors, FNP  amoxicillin (AMOXIL) 500 MG capsule Take 1 capsule (500 mg total) by mouth 3 (three) times daily. 09/28/18   Wynetta Fines, MD  buPROPion (WELLBUTRIN SR) 150 MG 12 hr tablet Take 1 tablet (150 mg total) by mouth 2 (two) times daily. 09/02/18   Bing Neighbors, FNP  cyclobenzaprine (FLEXERIL) 5 MG tablet Take 1-2 tablets  (5-10 mg total) by mouth 3 (three) times daily as needed for muscle spasms. 09/02/18   Bing Neighbors, FNP  famotidine (PEPCID) 20 MG tablet Take 1 tablet (20 mg total) by mouth 2 (two) times daily. 08/27/18   Khatri, Hina, PA-C  methocarbamol (ROBAXIN) 500 MG tablet Take 1 tablet (500 mg total) by mouth 2 (two) times daily. 11/06/18   Linwood Dibbles, MD  ondansetron (ZOFRAN) 4 MG tablet Take 1 tablet (4 mg total) by mouth every 8 (eight) hours as needed for nausea or vomiting. 09/02/18   Bing Neighbors, FNP  pantoprazole (PROTONIX) 40 MG tablet Take 1 tablet (40 mg total) by mouth daily. 09/02/18   Bing Neighbors, FNP  predniSONE (DELTASONE) 50 MG tablet Take 1 tablet (50 mg total) by mouth daily. 11/06/18   Linwood Dibbles, MD    Family History Family History  Problem Relation Age of Onset  . Healthy Mother   . Heart disease Father   . Kidney failure Father   . Neuropathy Father     Social History Social History   Tobacco Use  . Smoking status: Former Smoker    Types: Cigarettes    Last attempt to quit: 08/27/2018    Years since quitting: 0.1  . Smokeless tobacco: Never Used  Substance Use Topics  . Alcohol use: Yes    Comment: occasion  . Drug use: No  Allergies   Benadryl [diphenhydramine] and Iodinated diagnostic agents   Review of Systems Review of Systems  All other systems reviewed and are negative.    Physical Exam Updated Vital Signs BP 140/87   Pulse 88   Temp 98.5 F (36.9 C) (Oral)   Resp 16   Ht 1.575 m (5\' 2" )   Wt 90.7 kg   LMP 10/24/2018   SpO2 100%   BMI 36.58 kg/m   Physical Exam Vitals signs and nursing note reviewed.  Constitutional:      General: She is not in acute distress.    Appearance: She is well-developed.  HENT:     Head: Normocephalic and atraumatic.     Right Ear: External ear normal.     Left Ear: External ear normal.  Eyes:     General: No scleral icterus.       Right eye: No discharge.        Left eye: No discharge.       Conjunctiva/sclera: Conjunctivae normal.  Neck:     Musculoskeletal: Neck supple.     Trachea: No tracheal deviation.  Cardiovascular:     Rate and Rhythm: Normal rate and regular rhythm.     Comments: Normal perfusion and normal pulses in all extremities Pulmonary:     Effort: Pulmonary effort is normal. No respiratory distress.     Breath sounds: Normal breath sounds. No stridor. No wheezing or rales.  Abdominal:     General: Bowel sounds are normal. There is no distension.     Palpations: Abdomen is soft.     Tenderness: There is no abdominal tenderness. There is no guarding or rebound.  Musculoskeletal:     Cervical back: She exhibits tenderness. She exhibits no bony tenderness.     Thoracic back: She exhibits tenderness. She exhibits no swelling.     Comments: Paraspinal muscle tenderness diffusely in the cervical thoracic or lumbar spine, no erythema, no edema  Skin:    General: Skin is warm and dry.     Findings: No rash.  Neurological:     Mental Status: She is alert.     Cranial Nerves: No cranial nerve deficit (no facial droop, extraocular movements intact, no slurred speech).     Sensory: No sensory deficit.     Motor: No abnormal muscle tone or seizure activity.     Coordination: Coordination normal.     Comments: Normal strength and sensation bilateral upper and lower extremities,      ED Treatments / Results   Procedures Procedures (including critical care time)  Medications Ordered in ED Medications - No data to display   Initial Impression / Assessment and Plan / ED Course  I have reviewed the triage vital signs and the nursing notes.  Pertinent labs & imaging results that were available during my care of the patient were reviewed by me and considered in my medical decision making (see chart for details).   Suspect musculoskeletal etiology.  Patient is not having any focal neurologic deficits.  Symptoms are not suggestive of referred pain.  She does  not have any areas of swelling or tenderness.  Medical records indicates she has had prior imaging tests.  She is not have any symptoms to suggest acute infection or inflammation.  I doubt cardiac or pulmonary etiology.  Suspect musculoskeletal pain.  Plan discharge home with muscle relaxants and steroids.  Patient states she cannot take NSAIDs so she will take Tylenol.  Final Clinical Impressions(s) / ED  Diagnoses   Final diagnoses:  Acute midline thoracic back pain    ED Discharge Orders         Ordered    predniSONE (DELTASONE) 50 MG tablet  Daily     11/06/18 1455    methocarbamol (ROBAXIN) 500 MG tablet  2 times daily     11/06/18 1455           Linwood Dibbles, MD 11/06/18 1507

## 2018-11-06 NOTE — Discharge Instructions (Addendum)
Take the steroids and the prednisone as prescribed, you can also take Tylenol for pain and discomfort, try heat and topical medications for pain relief, follow-up with your doctor if the symptoms are not improving, return to the ED for fever, shortness of breath, weakness or other concerning symptoms

## 2019-02-22 ENCOUNTER — Other Ambulatory Visit: Payer: Self-pay

## 2019-02-22 ENCOUNTER — Encounter (HOSPITAL_COMMUNITY): Payer: Self-pay | Admitting: Emergency Medicine

## 2019-02-22 ENCOUNTER — Emergency Department (HOSPITAL_COMMUNITY)
Admission: EM | Admit: 2019-02-22 | Discharge: 2019-02-22 | Disposition: A | Discharge status: Home / Self Care | Payer: 59 | Attending: Emergency Medicine | Admitting: Emergency Medicine

## 2019-02-22 DIAGNOSIS — R251 Tremor, unspecified: Secondary | ICD-10-CM | POA: Insufficient documentation

## 2019-02-22 DIAGNOSIS — J45909 Unspecified asthma, uncomplicated: Secondary | ICD-10-CM | POA: Insufficient documentation

## 2019-02-22 DIAGNOSIS — M2550 Pain in unspecified joint: Secondary | ICD-10-CM | POA: Diagnosis not present

## 2019-02-22 DIAGNOSIS — Z87891 Personal history of nicotine dependence: Secondary | ICD-10-CM | POA: Diagnosis not present

## 2019-02-22 DIAGNOSIS — M791 Myalgia, unspecified site: Secondary | ICD-10-CM | POA: Insufficient documentation

## 2019-02-22 DIAGNOSIS — R030 Elevated blood-pressure reading, without diagnosis of hypertension: Secondary | ICD-10-CM | POA: Insufficient documentation

## 2019-02-22 LAB — CBC WITH DIFFERENTIAL/PLATELET
Abs Immature Granulocytes: 0.03 10*3/uL (ref 0.00–0.07)
Basophils Absolute: 0 10*3/uL (ref 0.0–0.1)
Basophils Relative: 0 %
Eosinophils Absolute: 0.1 10*3/uL (ref 0.0–0.5)
Eosinophils Relative: 2 %
HCT: 41.8 % (ref 36.0–46.0)
Hemoglobin: 13.3 g/dL (ref 12.0–15.0)
Immature Granulocytes: 0 %
Lymphocytes Relative: 19 %
Lymphs Abs: 1.6 10*3/uL (ref 0.7–4.0)
MCH: 32.1 pg (ref 26.0–34.0)
MCHC: 31.8 g/dL (ref 30.0–36.0)
MCV: 101 fL — ABNORMAL HIGH (ref 80.0–100.0)
Monocytes Absolute: 0.6 10*3/uL (ref 0.1–1.0)
Monocytes Relative: 8 %
Neutro Abs: 6 10*3/uL (ref 1.7–7.7)
Neutrophils Relative %: 71 %
Platelets: 273 10*3/uL (ref 150–400)
RBC: 4.14 MIL/uL (ref 3.87–5.11)
RDW: 12.5 % (ref 11.5–15.5)
WBC: 8.4 10*3/uL (ref 4.0–10.5)
nRBC: 0 % (ref 0.0–0.2)

## 2019-02-22 LAB — COMPREHENSIVE METABOLIC PANEL
ALT: 21 U/L (ref 0–44)
AST: 20 U/L (ref 15–41)
Albumin: 4.2 g/dL (ref 3.5–5.0)
Alkaline Phosphatase: 69 U/L (ref 38–126)
Anion gap: 9 (ref 5–15)
BUN: 7 mg/dL (ref 6–20)
CO2: 23 mmol/L (ref 22–32)
Calcium: 9.3 mg/dL (ref 8.9–10.3)
Chloride: 107 mmol/L (ref 98–111)
Creatinine, Ser: 0.72 mg/dL (ref 0.44–1.00)
GFR calc Af Amer: >60 mL/min (ref >60)
GFR calc non Af Amer: >60 mL/min (ref >60)
Glucose, Bld: 100 mg/dL — ABNORMAL HIGH (ref 70–99)
Potassium: 4.4 mmol/L (ref 3.5–5.1)
Sodium: 139 mmol/L (ref 135–145)
Total Bilirubin: 0.6 mg/dL (ref 0.3–1.2)
Total Protein: 7.8 g/dL (ref 6.5–8.1)

## 2019-02-22 LAB — CK: Total CK: 299 U/L — ABNORMAL HIGH (ref 38–234)

## 2019-02-22 MED ORDER — TRAMADOL HCL 50 MG PO TABS: 50.0000 mg | ORAL_TABLET | Freq: Four times a day (QID) | ORAL | 0 refills | Status: DC | PRN

## 2019-02-22 MED ORDER — ACETAMINOPHEN 500 MG PO TABS
1000.0000 mg | ORAL_TABLET | Freq: Once | ORAL | Status: AC
  Administered 2019-02-22: 1000 mg via ORAL
  Filled 2019-02-22: qty 2

## 2019-02-22 NOTE — ED Provider Notes (Signed)
[facility] EMERGENCY DEPARTMENT Provider Note   CSN: [id number] Arrival date & time: 02/22/19  0827    History   Chief Complaint Chief Complaint  Patient presents with   Bone Pain    HPI Marissa Benton is a 43 y.o. female presents emergency department chief complaint of muscle and joint pain.  Patient states that she has history of chronic spasms, tension.  She complains of pains "in my bones.  She states that it is worse in her shins and bilateral shoulders.  She also complains of pain in her suboccipital region, neck muscles and upper shoulders.  She states that she has been very tremulous since this started about a week ago.  She states that she feels that is due to pain.  She denies constipation, depression or other psychogenic features.  She denies significant dehydration, Pepsi cola colored urine, new medications.  The patient does not take any statin medications.     HPI  Past Medical History:  Diagnosis Date   Asthma    Stroke [facility])     Patient Active Problem List   Diagnosis Date Noted   Asthma 01/13/2016   Neck pain 01/13/2016   Right cervical radiculopathy 01/13/2016   Bilateral sciatica 01/13/2016   History of CVA (cerebrovascular accident) 01/13/2016   Insomnia 01/13/2016    Past Surgical History:  Procedure Laterality Date   DILATION AND CURETTAGE OF UTERUS       OB History   No obstetric history on file.      Home Medications    Prior to Admission medications   Medication Sig Start Date End Date Taking? Authorizing Provider  albuterol (PROVENTIL HFA;VENTOLIN HFA) 108 (90 Base) MCG/ACT inhaler Inhale 1-2 puffs into the lungs every 6 (six) hours as needed for wheezing or shortness of breath. 09/02/18   Scot Jun, FNP  amoxicillin (AMOXIL) 500 MG capsule Take 1 capsule (500 mg total) by mouth 3 (three) times daily. 09/28/18   Valarie Merino, MD  buPROPion (WELLBUTRIN SR) 150 MG 12 hr tablet Take 1 tablet (150 mg  total) by mouth 2 (two) times daily. 09/02/18   Scot Jun, FNP  cyclobenzaprine (FLEXERIL) 5 MG tablet Take 1-2 tablets (5-10 mg total) by mouth 3 (three) times daily as needed for muscle spasms. 09/02/18   Scot Jun, FNP  famotidine (PEPCID) 20 MG tablet Take 1 tablet (20 mg total) by mouth 2 (two) times daily. 08/27/18   Khatri, Hina, PA-C  methocarbamol (ROBAXIN) 500 MG tablet Take 1 tablet (500 mg total) by mouth 2 (two) times daily. 11/06/18   Dorie Rank, MD  ondansetron (ZOFRAN) 4 MG tablet Take 1 tablet (4 mg total) by mouth every 8 (eight) hours as needed for nausea or vomiting. 09/02/18   Scot Jun, FNP  pantoprazole (PROTONIX) 40 MG tablet Take 1 tablet (40 mg total) by mouth daily. 09/02/18   Scot Jun, FNP  predniSONE (DELTASONE) 50 MG tablet Take 1 tablet (50 mg total) by mouth daily. 11/06/18   Dorie Rank, MD  traMADol (ULTRAM) 50 MG tablet Take 1 tablet (50 mg total) by mouth every 6 (six) hours as needed. 02/22/19   Margarita Mail, PA-C    Family History Family History  Problem Relation Age of Onset   Healthy Mother    Heart disease Father    Kidney failure Father    Neuropathy Father     Social History Social History   Tobacco Use   Smoking status:  Former Smoker    Types: Cigarettes    Quit date: 08/27/2018    Years since quitting: 0.4   Smokeless tobacco: Never Used  Substance Use Topics   Alcohol use: Yes    Comment: occasion   Drug use: No     Allergies   Benadryl [diphenhydramine] and Iodinated diagnostic agents   Review of Systems Review of Systems Ten systems reviewed and are negative for acute change, except as noted in the HPI.    Physical Exam Updated Vital Signs BP (!) 156/96 (BP Location: Left Arm)    Pulse 84    Temp 98.5 F (36.9 C) (Oral)    Resp 16    Ht 5\' 2"  (1.575 m)    Wt 90.7 kg    SpO2 97%    BMI 36.58 kg/m   Physical Exam Vitals signs and nursing note reviewed.  Constitutional:      General:  She is not in acute distress.    Appearance: She is well-developed. She is not diaphoretic.  HENT:     Head: Normocephalic and atraumatic.  Eyes:     General: No scleral icterus.    Conjunctiva/sclera: Conjunctivae normal.  Neck:     Musculoskeletal: Normal range of motion.  Cardiovascular:     Rate and Rhythm: Normal rate and regular rhythm.     Heart sounds: Normal heart sounds. No murmur. No friction rub. No gallop.   Pulmonary:     Effort: Pulmonary effort is normal. No respiratory distress.     Breath sounds: Normal breath sounds.  Abdominal:     General: Bowel sounds are normal. There is no distension.     Palpations: Abdomen is soft. There is no mass.     Tenderness: There is no abdominal tenderness. There is no guarding.  Musculoskeletal:     Comments: Patient with no rashes to the scalp.  No tenderness along the EOP or occipital lines.  She is tender to palpation of the suboccipital musculature and cervical paraspinals along with bilateral trapezius.  Her muscles are very tight.  She has full active range of motion of her shoulders but complains of "bone pain" when she moves them she has full range of motion of the bilateral knees and ankles she has no calf swelling or tenderness.  Her compartments are soft.  She has normal sensation.  Skin:    General: Skin is warm and dry.  Neurological:     Mental Status: She is alert and oriented to person, place, and time.     Comments: Very tremulous with movement of the hands.  No tremor at rest Negative Tchvostek sign   Psychiatric:        Behavior: Behavior normal.      ED Treatments / Results  Labs (all labs ordered are listed, but only abnormal results are displayed) Labs Reviewed  CBC WITH DIFFERENTIAL/PLATELET - Abnormal; Notable for the following components:      Result Value   MCV 101.0 (*)    All other components within normal limits  COMPREHENSIVE METABOLIC PANEL - Abnormal; Notable for the following components:    Glucose, Bld 100 (*)    All other components within normal limits  CK - Abnormal; Notable for the following components:   Total CK 299 (*)    All other components within normal limits    EKG None  Radiology No results found.  Procedures Procedures (including critical care time)  Medications Ordered in ED Medications  acetaminophen (TYLENOL) tablet 1,000  mg (1,000 mg Oral Given 02/22/19 0909)     Initial Impression / Assessment and Plan / ED Course  I have reviewed the triage vital signs and the nursing notes.  Pertinent labs & imaging results that were available during my care of the patient were reviewed by me and considered in my medical decision making (see chart for details).        43 year old female with "bone pain" she is quite tremulous.  My differential includes rhabdomyolysis, myositis, drug-induced myositis, given her tremulousness I was concerned for electrolyte abnormality such as hypocalcemia or hyper calcium Mia.  This could also be secondary to endocrine dysfunction such as hyperparathyroidism.  I have reviewed the patient's labs which shows macrocytosis without obvious anemia her CMP shows no obvious abnormalities.  The patient does not appear to have any emergent cause of her symptoms.  I believe this is likely musculoskeletal and have recommended stretching, chiropractic care, massage therapy, physical therapy and stress reduction techniques.  Feel the patient is appropriate for discharge and I have discussed return precautions.  She has primary care and may follow closely with them.  Final Clinical Impressions(s) / ED Diagnoses   Final diagnoses:  Myalgia  Arthralgia, unspecified joint  Tremulousness  Elevated blood pressure reading    ED Discharge Orders         Ordered    traMADol (ULTRAM) 50 MG tablet  Every 6 hours PRN     02/22/19 1006           Arthor CaptainHarris, Rivaan Kendall, PA-C 02/23/19 1356    Raeford RazorKohut, Stephen, MD 02/26/19 1112

## 2019-02-22 NOTE — Discharge Instructions (Signed)
Your labs show no significant abnormalities. These make sure that you are drinking plenty of water. May add an over-the-counter magnesium supplement which can help with muscle relaxation. Discharging you with tramadol which is a very mild chronic pain medication which you may take along with Motrin or Tylenol.  I would start with half a tablet to see how well it helps and progressed to whole.  Understand that it will not completely take your pain away but it may make things more tolerable. There does not seem to be an electrolyte disturbance as the cause of your tremulousness and muscle pain.  Does not fully rule out all possibilities of why you are having the symptoms.  You must follow closely with your primary care physician.  Call to make an urgent appointment and be seen within the next 7 to 10 days.  Make sure that you are staying well-hydrated, getting plenty of rest.  If you are having difficulty sleeping you may use over-the-counter Benadryl or Unisom.  Return for any new or worsening symptoms including severe muscle cramps and spasm, inability to urinate, dark or Pepsi-Cola colored urine.

## 2019-02-22 NOTE — ED Triage Notes (Signed)
Pt endorses bone pain since Sunday. States it is hard to explain but her bones have been hurting all over. Endorses a slight HA. States her bones have been "crackling" and not being able to perform her normal ADLs.

## 2019-03-02 ENCOUNTER — Telehealth: Payer: Self-pay

## 2019-03-02 NOTE — Telephone Encounter (Signed)
Called patient to do their pre-visit COVID screening.  Call went to voicemail. Unable to do prescreening.  

## 2019-03-06 ENCOUNTER — Other Ambulatory Visit: Payer: Self-pay

## 2019-03-06 ENCOUNTER — Ambulatory Visit (INDEPENDENT_AMBULATORY_CARE_PROVIDER_SITE_OTHER): Payer: 59 | Admitting: Family Medicine

## 2019-03-06 ENCOUNTER — Encounter: Payer: Self-pay | Admitting: Family Medicine

## 2019-03-06 VITALS — BP 134/91 | HR 78 | Temp 97.2°F | Resp 17 | Ht 64.0 in | Wt 202.6 lb

## 2019-03-06 DIAGNOSIS — M62838 Other muscle spasm: Secondary | ICD-10-CM

## 2019-03-06 DIAGNOSIS — Z3202 Encounter for pregnancy test, result negative: Secondary | ICD-10-CM | POA: Diagnosis not present

## 2019-03-06 DIAGNOSIS — Z1389 Encounter for screening for other disorder: Secondary | ICD-10-CM | POA: Diagnosis not present

## 2019-03-06 DIAGNOSIS — Z131 Encounter for screening for diabetes mellitus: Secondary | ICD-10-CM

## 2019-03-06 DIAGNOSIS — M5410 Radiculopathy, site unspecified: Secondary | ICD-10-CM | POA: Diagnosis not present

## 2019-03-06 DIAGNOSIS — Z0000 Encounter for general adult medical examination without abnormal findings: Secondary | ICD-10-CM

## 2019-03-06 DIAGNOSIS — Z0001 Encounter for general adult medical examination with abnormal findings: Secondary | ICD-10-CM | POA: Diagnosis not present

## 2019-03-06 DIAGNOSIS — E559 Vitamin D deficiency, unspecified: Secondary | ICD-10-CM

## 2019-03-06 DIAGNOSIS — Z13220 Encounter for screening for lipoid disorders: Secondary | ICD-10-CM

## 2019-03-06 DIAGNOSIS — M792 Neuralgia and neuritis, unspecified: Secondary | ICD-10-CM

## 2019-03-06 DIAGNOSIS — Z1321 Encounter for screening for nutritional disorder: Secondary | ICD-10-CM

## 2019-03-06 DIAGNOSIS — G8929 Other chronic pain: Secondary | ICD-10-CM

## 2019-03-06 DIAGNOSIS — M791 Myalgia, unspecified site: Secondary | ICD-10-CM

## 2019-03-06 DIAGNOSIS — Z1329 Encounter for screening for other suspected endocrine disorder: Secondary | ICD-10-CM

## 2019-03-06 DIAGNOSIS — Z130 Encounter for screening for diseases of the blood and blood-forming organs and certain disorders involving the immune mechanism: Secondary | ICD-10-CM

## 2019-03-06 DIAGNOSIS — Z1239 Encounter for other screening for malignant neoplasm of breast: Secondary | ICD-10-CM

## 2019-03-06 DIAGNOSIS — R748 Abnormal levels of other serum enzymes: Secondary | ICD-10-CM

## 2019-03-06 DIAGNOSIS — I10 Essential (primary) hypertension: Secondary | ICD-10-CM | POA: Diagnosis not present

## 2019-03-06 LAB — POCT URINALYSIS DIP (CLINITEK)
Bilirubin, UA: NEGATIVE
Glucose, UA: NEGATIVE mg/dL
Ketones, POC UA: NEGATIVE mg/dL
Nitrite, UA: NEGATIVE
POC PROTEIN,UA: NEGATIVE
Spec Grav, UA: >=1.03 — AB (ref 1.010–1.025)
Urobilinogen, UA: 0.2 E.U./dL
pH, UA: 5.5 (ref 5.0–8.0)

## 2019-03-06 LAB — POCT URINE PREGNANCY: Preg Test, Ur: NEGATIVE

## 2019-03-06 MED ORDER — AMITRIPTYLINE HCL 75 MG PO TABS: 75.0000 mg | ORAL_TABLET | Freq: Every day | ORAL | 2 refills | Status: DC

## 2019-03-06 NOTE — Patient Instructions (Signed)

## 2019-03-06 NOTE — Progress Notes (Signed)
Patient ID: Marissa Benton, female    DOB: [birth year], 43 y.o.   MRN: [id number]  PCP: Marissa Neighbors, FNP  Chief Complaint  Patient presents with  . Annual Exam    Subjective:  HPI Marissa Benton is a 43 y.o. female, is a former smoker, history of CVA, presents for complete physical exam.  Chronic conditions include: Marissa Benton has Asthma; Neck pain; Right cervical radiculopathy; Bilateral sciatica; History of CVA (cerebrovascular accident); and Insomnia on their problem list.  Myalgias, neuropathic pain, muscle spasms, cervical radiculopathy, and sciatica. -During the start of his initial visit here in office back in January patient complained of symptoms of chronic cervical radiculopathy and bilateral low sciatica along with nonspecific neuropathic pain occurring in bilateral legs along with upper extremities.  She was referred to neurology however was unable to be seen due to a previous bed did along with COVID-19.  She is interested in following up with neurology.  She recently presented to the ER with symptoms of myalgia, neuropathic pain, and paresthesias.  She was prescribed tramadol which did not improve pain symptoms at all.  She was noted to have a CK level of 299.  She has no known history of rhabdomyolysis.  Pain symptoms have been ongoing issue for greater than a year.  She reports ER provider suggested that she may have fibromyalgia.  -Elevated blood pressure-She has had elevations in blood pressure during recent ER visits and during a previous visit here in office.  She reports after stroke she was placed on medications however these medications were discontinued sometime ago.  She does not routinely check her blood pressure and has been without blood pressure medicines for at least 2 years.  Denies any headaches, chest pain, shortness of breath or weakness.  -Asthma has been well controlled. No recent exacerbations. Since stopping smoking flares have reduced in  frequency.    Health Promotion: Routine physical activity: Engages in routine physical activity [place] reports walking throughout the field almost daily and she also engages in daily gardening.  She has a caretaker of both of her elderly parents.  Current BMI: Body mass index is 34.78 kg/m.   Dietary habits: Low sodium. Endorses efforts to engage in healthy eating practices in efforts to reduce weight and prevent weight gain.  Health Screening Current/Overdue:   Immunizations-updated  PAP-current as 2019.  Pap was normal. Mammogram, no prior mammogram.  Would like a mammogram. Last Dental Exam: No recent dental visit however will schedule within a few months. Last Eye Exam:  Current .  She wears corrective lenses and has had no recent change in vision.   Current home medications include: Prior to Admission medications   Medication Sig Start Date End Date Taking? Authorizing Provider  albuterol (PROVENTIL HFA;VENTOLIN HFA) 108 (90 Base) MCG/ACT inhaler Inhale 1-2 puffs into the lungs every 6 (six) hours as needed for wheezing or shortness of breath. 09/02/18  Yes Marissa Neighbors, FNP  pantoprazole (PROTONIX) 40 MG tablet Take 1 tablet (40 mg total) by mouth daily. 09/02/18  Yes Marissa Neighbors, FNP  traMADol (ULTRAM) 50 MG tablet Take 1 tablet (50 mg total) by mouth every 6 (six) hours as needed. 02/22/19  Yes Marissa Captain, PA-C    Family History  Problem Relation Age of Onset  . Healthy Mother   . Heart disease Father   . Kidney failure Father   . Neuropathy Father     Allergies  Allergen Reactions  .  Benadryl [Diphenhydramine] Hives    Benadryl IV.  Can take PO Benadryl   . Iodinated Diagnostic Agents     Pt unaware of this allergy     Social History   Socioeconomic History  . Marital status: Married    Spouse name: Not on file  . Number of children: Not on file  . Years of education: Not on file  . Highest education level: Not on file   Occupational History  . Not on file  Social Needs  . Financial resource strain: Not on file  . Food insecurity    Worry: Not on file    Inability: Not on file  . Transportation needs    Medical: Not on file    Non-medical: Not on file  Tobacco Use  . Smoking status: Former Smoker    Types: Cigarettes    Quit date: 08/27/2018    Years since quitting: 0.5  . Smokeless tobacco: Never Used  Substance and Sexual Activity  . Alcohol use: Yes    Comment: occasion  . Drug use: No  . Sexual activity: Not on file  Lifestyle  . Physical activity    Days per week: Not on file    Minutes per session: Not on file  . Stress: Not on file  Relationships  . Social Musician on phone: Not on file    Gets together: Not on file    Attends religious service: Not on file    Active member of club or organization: Not on file    Attends meetings of clubs or organizations: Not on file    Relationship status: Not on file  . Intimate partner violence    Fear of current or ex partner: Not on file    Emotionally abused: Not on file    Physically abused: Not on file    Forced sexual activity: Not on file  Other Topics Concern  . Not on file  Social History Narrative  . Not on file   Review of Systems Pertinent negatives listed in HPI Past Medical, Surgical Family and Social History reviewed and updated.  Objective:   Today's Vitals   03/06/19 0839  BP: (!) 134/91  Pulse: 78  Resp: 17  Temp: (!) 97.2 F (36.2 C)  TempSrc: Temporal  SpO2: 96%  Weight: 202 lb 9.6 oz (91.9 kg)  Height: 5\' 4"  (1.626 m)    Wt Readings from Last 3 Encounters:  03/06/19 202 lb 9.6 oz (91.9 kg)  02/22/19 200 lb (90.7 kg)  11/06/18 200 lb (90.7 kg)    Physical Exam Constitutional: Patient appears well-developed and well-nourished. No distress. HENT: Normocephalic, atraumatic, External right and left ear normal. Oropharynx is clear and moist.  Eyes: Conjunctivae and EOM are normal. PERRLA,  no scleral icterus. Neck: Normal ROM. Neck supple. No JVD. No tracheal deviation. No thyromegaly. CVS: RRR, S1/S2 +, no murmurs, no gallops, no carotid bruit.  Pulmonary: Effort and breath sounds normal, no stridor, rhonchi, wheezes, rales.  Abdominal: Soft. BS +, no distension, tenderness, rebound or guarding.  Musculoskeletal: Normal range of motion. Trace LE edema and no tenderness.  Neuro: Alert. Normal reflexes, muscle tone coordination. No cranial nerve deficit. Skin: Skin is warm and dry. No rash noted. Not diaphoretic. No erythema. No pallor. Psychiatric: Normal mood and affect. Behavior, judgment, thought content normal.   Assessment & Plan:  1. Annual physical exam -Age-appropriate anticipatory guidance provided.  2. Screening for blood or protein in urine - POCT  URINALYSIS DIP (CLINITEK) - POCT urine pregnancy  3. Screening for thyroid disorder - Thyroid Panel With TSH  4. Screening for diabetes mellitus - Hemoglobin A1c - Comprehensive metabolic panel  5. Screening cholesterol level - Lipid panel  6. Screening, anemia, deficiency, iron - CBC  7. Screening for breast cancer - MM 3D SCREEN BREAST BILATERAL; Future  8. Elevated CK -Recent CK level 299. Will repeat CK level today. - Ambulatory referral to Orthopedic Surgery, referring to orthopedic speciality for second opinion of chronic myalgia and radicular pain.  9. Encounter for vitamin deficiency screening - Vitamin D, 25-hydroxy  10. Muscle spasm - Ambulatory referral to Orthopedic Surgery 11. Neuropathic pain - Ambulatory referral to Neurology 12. Chronic radicular pain of lower extremity - Ambulatory referral to Orthopedic Surgery - Ambulatory referral to Neurology 13. Myalgia - Ambulatory referral to Orthopedic Surgery -Trial Amitriptyline 75 mg once daily at bedtime however can increase to 150 mg at bedtime if 75 mg is ineffective for symptoms of neuropathic pain and myalgias.  Uncertain of the  specific etiology of symptoms although they have been present for well over a year.  Patient has been sent to both Ortho and neurology for further work-up and evaluation.   14.  Elevated blood pressure with a history of hypertension Patient's blood pressure has been marginally elevated during recent visits.  She has a history of a CVA and hypertension although notes previous provider discontinued hypertensive medications as blood pressure corrected itself.  Advised patient to check blood pressure at home goal readings is less than 130/90.  If blood pressure remains elevated will need to resume a low-dose blood pressure medication.    Return for follow-up in 8 weeks to evaluate effectiveness of amitriptyline and also to recheck blood pressure and evaluate for possible restart of antihypertension medications.  A total of 40 minutes spent, greater than 50 % of this time was spent counseling and coordination of care.    Joaquin Courts, FNP Primary Care at [facility] [address] [place] [address] [phone]: [phone]

## 2019-03-07 LAB — HEMOGLOBIN A1C
Est. average glucose Bld gHb Est-mCnc: 108 mg/dL
Hgb A1c MFr Bld: 5.4 % (ref 4.8–5.6)

## 2019-03-07 LAB — LIPID PANEL
Chol/HDL Ratio: 2.2 ratio (ref 0.0–4.4)
Cholesterol, Total: 132 mg/dL (ref 100–199)
HDL: 61 mg/dL (ref >39)
LDL Calculated: 59 mg/dL (ref 0–99)
Triglycerides: 58 mg/dL (ref 0–149)
VLDL Cholesterol Cal: 12 mg/dL (ref 5–40)

## 2019-03-07 LAB — CBC
Hematocrit: 41.1 % (ref 34.0–46.6)
Hemoglobin: 13.1 g/dL (ref 11.1–15.9)
MCH: 31.6 pg (ref 26.6–33.0)
MCHC: 31.9 g/dL (ref 31.5–35.7)
MCV: 99 fL — ABNORMAL HIGH (ref 79–97)
Platelets: 302 10*3/uL (ref 150–450)
RBC: 4.14 x10E6/uL (ref 3.77–5.28)
RDW: 11.8 % (ref 11.7–15.4)
WBC: 9.3 10*3/uL (ref 3.4–10.8)

## 2019-03-07 LAB — COMPREHENSIVE METABOLIC PANEL
ALT: 14 IU/L (ref 0–32)
AST: 12 IU/L (ref 0–40)
Albumin/Globulin Ratio: 1.6 (ref 1.2–2.2)
Albumin: 4.7 g/dL (ref 3.8–4.8)
Alkaline Phosphatase: 83 IU/L (ref 39–117)
BUN/Creatinine Ratio: 13 (ref 9–23)
BUN: 11 mg/dL (ref 6–24)
Bilirubin Total: 0.3 mg/dL (ref 0.0–1.2)
CO2: 21 mmol/L (ref 20–29)
Calcium: 9.8 mg/dL (ref 8.7–10.2)
Chloride: 102 mmol/L (ref 96–106)
Creatinine, Ser: 0.82 mg/dL (ref 0.57–1.00)
GFR calc Af Amer: 102 mL/min/{1.73_m2} (ref >59)
GFR calc non Af Amer: 89 mL/min/{1.73_m2} (ref >59)
Globulin, Total: 3 g/dL (ref 1.5–4.5)
Glucose: 118 mg/dL — ABNORMAL HIGH (ref 65–99)
Potassium: 4.5 mmol/L (ref 3.5–5.2)
Sodium: 139 mmol/L (ref 134–144)
Total Protein: 7.7 g/dL (ref 6.0–8.5)

## 2019-03-07 LAB — THYROID PANEL WITH TSH
Free Thyroxine Index: 2.1 (ref 1.2–4.9)
T3 Uptake Ratio: 25 % (ref 24–39)
T4, Total: 8.5 ug/dL (ref 4.5–12.0)
TSH: 0.933 u[IU]/mL (ref 0.450–4.500)

## 2019-03-07 LAB — CK: Total CK: 97 U/L (ref 32–182)

## 2019-03-07 LAB — VITAMIN D 25 HYDROXY (VIT D DEFICIENCY, FRACTURES): Vit D, 25-Hydroxy: 23 ng/mL — ABNORMAL LOW (ref 30.0–100.0)

## 2019-03-08 ENCOUNTER — Ambulatory Visit (INDEPENDENT_AMBULATORY_CARE_PROVIDER_SITE_OTHER): Payer: 59 | Admitting: Family Medicine

## 2019-03-08 ENCOUNTER — Encounter: Payer: Self-pay | Admitting: Family Medicine

## 2019-03-08 ENCOUNTER — Other Ambulatory Visit: Payer: Self-pay

## 2019-03-08 DIAGNOSIS — M79642 Pain in left hand: Secondary | ICD-10-CM

## 2019-03-08 DIAGNOSIS — M25512 Pain in left shoulder: Secondary | ICD-10-CM

## 2019-03-08 DIAGNOSIS — M25511 Pain in right shoulder: Secondary | ICD-10-CM

## 2019-03-08 DIAGNOSIS — M545 Low back pain: Secondary | ICD-10-CM

## 2019-03-08 DIAGNOSIS — E559 Vitamin D deficiency, unspecified: Secondary | ICD-10-CM

## 2019-03-08 DIAGNOSIS — M542 Cervicalgia: Secondary | ICD-10-CM | POA: Diagnosis not present

## 2019-03-08 DIAGNOSIS — M79641 Pain in right hand: Secondary | ICD-10-CM | POA: Diagnosis not present

## 2019-03-08 DIAGNOSIS — G8929 Other chronic pain: Secondary | ICD-10-CM

## 2019-03-08 NOTE — Progress Notes (Signed)
Office Visit Note   Patient: Marissa Benton           Date of Birth: [birth year]           MRN: [id number] Visit Date: 03/08/2019 Requested by: Marissa NeighborsHarris, Kimberly S, FNP [address] [place] [id number] PCP: Marissa NeighborsHarris, Kimberly S, FNP  Subjective: Chief Complaint  Patient presents with   "burning" in shldrs, spasms in thighs, stiffness in joints    HPI: She is here with multiple areas of pain.  She states that for many months now she has had pain in various areas.  She almost always has pain in her neck radiating into her shoulders but occasionally she has pain in her wrists and hands, lower back, hips and knees.  [place] moved here from [place] and she went to a chiropractor there, which really did not help that much.  She cannot take NSAIDs due to history of peptic ulcer disease.  She has tried a couple different muscle relaxants and tramadol but none of these seem to eliminate her pain.  Denies any family history of rheumatologic disease.  Her father is diabetic with end-stage renal disease and peripheral neuropathy.  Patient had labs drawn recently showing vitamin D deficiency with a level of 23, normal hemoglobin A1c of 5.4, and a blood sugar of 118 but patient does not think she was fasting for these labs.  Patient states that many of her joints pop and are sometimes painful when they pop.  Denies any fevers, chills, night sweats, unintentional weight change.              ROS:   All other systems were reviewed and are negative.  Objective: Vital Signs: LMP 02/17/2019 (Approximate)   Physical Exam:  General:  Alert and oriented, in no acute distress. Pulm:  Breathing unlabored. Psy:  Normal mood, congruent affect. Skin: No visible rash on her skin. Neck: Good range of motion with negative Spurling'Benton test.  She does have some tenderness in the cervical paraspinous muscles and trapezius muscles.  Upper extremity range of motion is normal and strength normal.  She has  no joint effusions in her elbows, wrists or hands. Low back: Good range of motion with flexion and extension.  Negative bilateral straight leg raise.  Lower extremity strength normal and she has no knee or ankle joint effusions.  Imaging: None today.  Previous x-rays reviewed on computer of the cervical spine show reversed cervical curve but otherwise well-preserved disc spaces and no significant facet arthropathy.  Thoracic and lumbar x-rays are unremarkable.   Assessment & Plan: 1.  Chronic neck and multiple other joint pain, etiology uncertain.  She has vitamin D deficiency but I do not think it accounts for her symptoms. -We will treat her vitamin D deficiency.  We will draw some additional labs looking for rheumatologic disease.  We will also order cervical spine MRI scan.  Depending on results, consider a trial of physical therapy.     Procedures: No procedures performed  No notes on file     PMFS History: Patient Active Problem List   Diagnosis Date Noted   Vitamin D deficiency 03/08/2019   Asthma 01/13/2016   Neck pain 01/13/2016   Right cervical radiculopathy 01/13/2016   Bilateral sciatica 01/13/2016   History of CVA (cerebrovascular accident) 01/13/2016   Insomnia 01/13/2016   Past Medical History:  Diagnosis Date   Asthma    Stroke [facility](HCC)     Family History  Problem Relation Age of Onset   Healthy Mother    Heart disease Father    Kidney failure Father    Neuropathy Father     Past Surgical History:  Procedure Laterality Date   DILATION AND CURETTAGE OF UTERUS     Social History   Occupational History   Not on file  Tobacco Use   Smoking status: Former Smoker    Types: Cigarettes    Quit date: 08/27/2018    Years since quitting: 0.5   Smokeless tobacco: Never Used  Substance and Sexual Activity   Alcohol use: Yes    Comment: occasion   Drug use: No   Sexual activity: Not on file

## 2019-03-09 ENCOUNTER — Telehealth: Payer: Self-pay | Admitting: Family Medicine

## 2019-03-09 LAB — C-REACTIVE PROTEIN: CRP: 6.9 mg/L (ref <8.0)

## 2019-03-09 LAB — SEDIMENTATION RATE: Sed Rate: 14 mm/h (ref 0–20)

## 2019-03-09 LAB — CYCLIC CITRUL PEPTIDE ANTIBODY, IGG: Cyclic Citrullin Peptide Ab: <16 UNITS

## 2019-03-09 LAB — RHEUMATOID FACTOR: Rheumatoid fact SerPl-aCnc: <14 IU/mL (ref <14)

## 2019-03-09 LAB — URIC ACID: Uric Acid, Serum: 5.2 mg/dL (ref 2.5–7.0)

## 2019-03-09 LAB — ANA: Anti Nuclear Antibody (ANA): NEGATIVE

## 2019-03-09 NOTE — Telephone Encounter (Signed)
Labs look good so far.  Two are still pending.

## 2019-03-10 ENCOUNTER — Telehealth: Payer: Self-pay | Admitting: Family Medicine

## 2019-03-10 NOTE — Telephone Encounter (Signed)
Labs look good, no sign of inflammatory or rheumatoid process.  Will await MRI results.

## 2019-03-10 NOTE — Addendum Note (Signed)
Addended by: Scot Jun on: 03/10/2019 08:01 AM   Modules accepted: Orders

## 2019-04-10 ENCOUNTER — Encounter: Payer: Self-pay | Admitting: *Deleted

## 2019-05-05 ENCOUNTER — Ambulatory Visit: Payer: 59 | Admitting: Family Medicine

## 2019-05-30 ENCOUNTER — Emergency Department (HOSPITAL_COMMUNITY)
Admission: EM | Admit: 2019-05-30 | Discharge: 2019-05-30 | Disposition: A | Discharge status: Home / Self Care | Payer: 59 | Attending: Emergency Medicine | Admitting: Emergency Medicine

## 2019-05-30 ENCOUNTER — Other Ambulatory Visit: Payer: Self-pay

## 2019-05-30 ENCOUNTER — Encounter (HOSPITAL_COMMUNITY): Payer: Self-pay | Admitting: Emergency Medicine

## 2019-05-30 DIAGNOSIS — J069 Acute upper respiratory infection, unspecified: Secondary | ICD-10-CM | POA: Diagnosis not present

## 2019-05-30 DIAGNOSIS — Z79899 Other long term (current) drug therapy: Secondary | ICD-10-CM | POA: Insufficient documentation

## 2019-05-30 DIAGNOSIS — R070 Pain in throat: Secondary | ICD-10-CM | POA: Diagnosis present

## 2019-05-30 DIAGNOSIS — Z87891 Personal history of nicotine dependence: Secondary | ICD-10-CM | POA: Insufficient documentation

## 2019-05-30 DIAGNOSIS — Z8673 Personal history of transient ischemic attack (TIA), and cerebral infarction without residual deficits: Secondary | ICD-10-CM | POA: Diagnosis not present

## 2019-05-30 DIAGNOSIS — J45909 Unspecified asthma, uncomplicated: Secondary | ICD-10-CM | POA: Insufficient documentation

## 2019-05-30 DIAGNOSIS — Z20828 Contact with and (suspected) exposure to other viral communicable diseases: Secondary | ICD-10-CM | POA: Diagnosis not present

## 2019-05-30 NOTE — ED Triage Notes (Signed)
Pt states she works at a daycare and yesterday she started having sore throat and ear pain.

## 2019-05-30 NOTE — Discharge Instructions (Signed)
Please read attached information. If you experience any new or worsening signs or symptoms please return to the emergency room for evaluation. Please follow-up with your primary care provider or specialist as discussed.  °

## 2019-05-30 NOTE — ED Provider Notes (Signed)
[facility] EMERGENCY DEPARTMENT Provider Note   CSN: [id number] Arrival date & time: 05/30/19  1334     History   Chief Complaint Chief Complaint  Patient presents with  . Sore Throat    HPI Marissa Benton is a 43 y.o. female.     HPI   43 year old female presents today with sore throat and cough.  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  She notes yesterday she started to develop sore throat, bilateral ear pain and nonproductive cough.  She denies any shortness of breath.  She notes history of asthma denies any complications from this.  She notes she is otherwise healthy.  No medications prior to arrival.  No known covert exposures.  Past Medical History:  Diagnosis Date  . Asthma   . Stroke [facility])     Patient Active Problem List   Diagnosis Date Noted  . Vitamin D deficiency 03/08/2019  . Asthma 01/13/2016  . Neck pain 01/13/2016  . Right cervical radiculopathy 01/13/2016  . 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 History of CVA (cerebrovascular accident) 01/13/2016  . Insomnia 01/13/2016    Past Surgical History:  Procedure Laterality Date  . DILATION AND CURETTAGE OF UTERUS       OB History   No obstetric history on file.      Home Medications    Prior to Admission medications   Medication Sig Start Date End Date Taking? Authorizing Provider  albuterol (PROVENTIL HFA;VENTOLIN HFA) 108 (90 Base) MCG/ACT inhaler Inhale 1-2 puffs into the lungs every 6 (six) hours as needed for wheezing or shortness of breath. 09/02/18   Scot Jun, FNP  amitriptyline (ELAVIL) 75 MG tablet Take 1-2 tablets (75-150 mg total) by mouth at bedtime. 03/06/19   Scot Jun, FNP  pantoprazole (PROTONIX) 40 MG tablet Take 1 tablet (40 mg total) by mouth daily. 09/02/18   Scot Jun, FNP    Family History Family History  Problem Relation Age of Onset  . Healthy Mother   . Heart disease Father   . Kidney failure Father    . Neuropathy Father     Social History Social History   Tobacco Use  . Smoking status: Former Smoker    Types: Cigarettes    Quit date: 08/27/2018    Years since quitting: 0.7  . Smokeless tobacco: Never Used  Substance Use Topics  . Alcohol use: Yes    Comment: occasion  . Drug use: No     Allergies   Benadryl [diphenhydramine] and Iodinated diagnostic agents   Review of Systems Review of Systems  All other systems reviewed and are negative.    Physical Exam Updated Vital Signs BP (!) 147/99 (BP Location: Right Arm)   Pulse 78   Temp 98.5 F (36.9 C) (Oral)   Resp 16   SpO2 98%   Physical Exam Vitals signs and nursing note reviewed.  Constitutional:      Appearance: She is well-developed.  HENT:     Head: Normocephalic and atraumatic.     Comments: Oropharynx without erythema exudate or swelling.,  Voice is normal, no pooling of secretions Eyes:     General: No scleral icterus.       Right eye: No discharge.        Left eye: No discharge.     Conjunctiva/sclera: Conjunctivae normal.     Pupils: Pupils are equal, round, and reactive to light.  Neck:     Musculoskeletal:  Normal range of motion.     Vascular: No JVD.     Trachea: No tracheal deviation.  Pulmonary:     Effort: Pulmonary effort is normal. No respiratory distress.     Breath sounds: Normal breath sounds. No stridor. No wheezing, rhonchi or rales.  Neurological:     Mental Status: She is alert and oriented to person, place, and time.     Coordination: Coordination normal.  Psychiatric:        Behavior: Behavior normal.        Thought Content: Thought content normal.        Judgment: Judgment normal.     ED Treatments / Results  Labs (all labs ordered are listed, but only abnormal results are displayed) Labs Reviewed  NOVEL CORONAVIRUS, NAA (HOSP ORDER, SEND-OUT TO REF LAB; TAT 18-24 HRS)    EKG None  Radiology No results found.  Procedures Procedures (including critical  care time)  Medications Ordered in ED Medications - No data to display   Initial Impression / Assessment and Plan / ED Course  I have reviewed the triage vital signs and the nursing notes.  Pertinent labs & imaging results that were available during my care of the patient were reviewed by me and considered in my medical decision making (see chart for details).        43 year old female presents today with viral upper respiratory infection.  Will test for COVID.  No signs of complicating features.  No signs of strep.  Discharged with symptomatic care and strict return precautions.  Verbalized understanding and agreement to this plan had no further questions or concerns.  Final Clinical Impressions(s) / ED Diagnoses   Final diagnoses:  Viral URI with cough    ED Discharge Orders    None       Rosalio Loud 05/30/19 1713    Gerhard Munch, MD 05/31/19 [phone]

## 2019-05-31 LAB — NOVEL CORONAVIRUS, NAA (HOSP ORDER, SEND-OUT TO REF LAB; TAT 18-24 HRS): SARS-CoV-2, NAA: NOT DETECTED

## 2019-07-05 ENCOUNTER — Ambulatory Visit
Admission: EM | Admit: 2019-07-05 | Discharge: 2019-07-05 | Disposition: A | Discharge status: Home / Self Care | Payer: 59 | Attending: Physician Assistant | Admitting: Physician Assistant

## 2019-07-05 DIAGNOSIS — R52 Pain, unspecified: Secondary | ICD-10-CM

## 2019-07-05 DIAGNOSIS — M797 Fibromyalgia: Secondary | ICD-10-CM | POA: Diagnosis not present

## 2019-07-05 HISTORY — DX: Fibromyalgia: M79.7

## 2019-07-05 MED ORDER — DULOXETINE HCL 30 MG PO CPEP: 30.0000 mg | ORAL_CAPSULE | Freq: Every day | ORAL | 0 refills | Status: DC

## 2019-07-05 NOTE — ED Provider Notes (Signed)
[name]    CSN: [id number] Arrival date & time: 07/05/19  3220      History   Chief Complaint Chief Complaint  Patient presents with  . Generalized Body Aches    HPI Marissa Benton is a 43 y.o. female.   43 year old female comes in for 4-day history of generalized body aches.  Patient states has history of fibromyalgia, and feels the same.  Feels that "bone is cracking", muscle aches, joint pain.  Denies any injury/trauma.  Denies numbness or tingling.  Denies fever, chills.  Denies URI symptoms such as cough, congestion, sore throat.  Denies shortness of breath, loss of taste or smell.  Denies Covid/sick contact.  States has tried Robaxin, Flexeril, tramadol, amitriptyline in the past without relief.  She saw orthopedics with pending MRI testing.  She recently lost her father, and feels that the stress induced fibromyalgia flareup.  States took ibuprofen with some relief, but upset her stomach from history of peptic ulcer.     Past Medical History:  Diagnosis Date  . Asthma   . Fibromyalgia   . Stroke [facility])     Patient Active Problem List   Diagnosis Date Noted  . Vitamin D deficiency 03/08/2019  . Asthma 01/13/2016  . Neck pain 01/13/2016  . Right cervical radiculopathy 01/13/2016  . 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 History of CVA (cerebrovascular accident) 01/13/2016  . Insomnia 01/13/2016    Past Surgical History:  Procedure Laterality Date  . DILATION AND CURETTAGE OF UTERUS      OB History   No obstetric history on file.      Home Medications    Prior to Admission medications   Medication Sig Start Date End Date Taking? Authorizing Provider  albuterol (PROVENTIL HFA;VENTOLIN HFA) 108 (90 Base) MCG/ACT inhaler Inhale 1-2 puffs into the lungs every 6 (six) hours as needed for wheezing or shortness of breath. 09/02/18   Scot Jun, FNP  DULoxetine (CYMBALTA) 30 MG capsule Take 1 capsule (30 mg total) by mouth daily. 07/05/19   Tasia Catchings, Lonell Stamos  V, PA-C  pantoprazole (PROTONIX) 40 MG tablet Take 1 tablet (40 mg total) by mouth daily. 09/02/18   Scot Jun, FNP  amitriptyline (ELAVIL) 75 MG tablet Take 1-2 tablets (75-150 mg total) by mouth at bedtime. 03/06/19 07/05/19  Scot Jun, FNP    Family History Family History  Problem Relation Age of Onset  . Healthy Mother   . Heart disease Father   . Kidney failure Father   . Neuropathy Father     Social History Social History   Tobacco Use  . Smoking status: Former Smoker    Types: Cigarettes    Quit date: 08/27/2018    Years since quitting: 0.8  . Smokeless tobacco: Never Used  Substance Use Topics  . Alcohol use: Yes    Comment: occasion  . Drug use: No     Allergies   Benadryl [diphenhydramine] and Iodinated diagnostic agents   Review of Systems Review of Systems  Reason unable to perform ROS: See HPI as above.     Physical Exam Triage Vital Signs ED Triage Vitals [07/05/19 1900]  Enc Vitals Group     BP (!) 157/102     Pulse Rate 75     Resp 20     Temp 98.1 F (36.7 C)     Temp Source Oral     SpO2 94 %     Weight      Height  Head Circumference      Peak Flow      Pain Score 8     Pain Loc      Pain Edu?      Excl. in GC?    No data found.  Updated Vital Signs BP (!) 157/102 (BP Location: Left Arm)   Pulse 75   Temp 98.1 F (36.7 C) (Oral)   Resp 20   LMP 06/07/2019   SpO2 94%   Physical Exam Constitutional:      General: She is not in acute distress.    Appearance: Normal appearance. She is not ill-appearing, toxic-appearing or diaphoretic.  HENT:     Head: Normocephalic and atraumatic.     Mouth/Throat:     Mouth: Mucous membranes are moist.     Pharynx: Oropharynx is clear. Uvula midline.  Neck:     Musculoskeletal: Normal range of motion and neck supple.  Cardiovascular:     Rate and Rhythm: Normal rate and regular rhythm.     Heart sounds: Normal heart sounds. No murmur. No friction rub. No gallop.    Pulmonary:     Effort: Pulmonary effort is normal. No accessory muscle usage, prolonged expiration, respiratory distress or retractions.     Comments: Lungs clear to auscultation without adventitious lung sounds. Musculoskeletal:     Comments: Diffuse tenderness to palpation of bilateral shoulder/back. Full ROM of neck, back, upper extremity. Strength normal and equal bilaterally. Sensation intact and equal bilaterally. Radial pulse 2+, cap refill <2s  Neurological:     General: No focal deficit present.     Mental Status: She is alert and oriented to person, place, and time.      UC Treatments / Results  Labs (all labs ordered are listed, but only abnormal results are displayed) Labs Reviewed - No data to display  EKG   Radiology No results found.  Procedures Procedures (including critical care time)  Medications Ordered in UC Medications - No data to display  Initial Impression / Assessment and Plan / UC Course  I have reviewed the triage vital signs and the nursing notes.  Pertinent labs & imaging results that were available during my care of the patient were reviewed by me and considered in my medical decision making (see chart for details).    Patient with significant side effects on the prednisone, and would like to avoid corticosteroid.  Discussed cannot rule out Covid given body aches, patient can quarantine for the next week, so will defer at this time and treat for possible fibromyalgia flare.  Patient has tried tramadol, Flexeril, Robaxin, amitriptyline without relief fibromyalgia flareups.  Will have patient try duloxetine at this time.  Patient to make appointment with primary care, for further evaluation and adjustment of medications needed.  Can also follow-up with orthopedics for further evaluation and management needed.  Discussed with patient if body aches do not improve with duloxetine, will develop URI symptoms, should test for COVID. Return precautions given.   Patient expresses understanding and agrees to plan.  Final Clinical Impressions(s) / UC Diagnoses   Final diagnoses:  Fibromyalgia  Generalized body aches   ED Prescriptions    Medication Sig Dispense Auth. Provider   DULoxetine (CYMBALTA) 30 MG capsule Take 1 capsule (30 mg total) by mouth daily. 14 capsule Cathie Hoops, Lasandra Batley V, PA-C     I have reviewed the PDMP during this encounter.   Belinda Fisher, PA-C 07/05/19 [phone]

## 2019-07-05 NOTE — ED Triage Notes (Signed)
Pt c/o body aches all over since Sunday. States she was dx'd with fibromyalgia 46months ago with no medication. States took a ibuprofen with some relief but it upsets her ulcer.

## 2019-07-05 NOTE — Discharge Instructions (Signed)
As discussed, discontinue amitriptyline.  Start duloxetine as directed.  We will continue to monitor symptoms.  Stay quarantined for now.  If develop URI symptoms, or no improvement in body aches, may need to test for Covid.  Please make an appointment with PCP to reevaluate, with possible increase in dosage if symptoms are improving with duloxetine.

## 2019-10-23 ENCOUNTER — Other Ambulatory Visit: Payer: Self-pay

## 2019-10-23 ENCOUNTER — Encounter (HOSPITAL_COMMUNITY): Payer: Self-pay | Admitting: Emergency Medicine

## 2019-10-23 ENCOUNTER — Emergency Department (HOSPITAL_COMMUNITY)
Admission: EM | Admit: 2019-10-23 | Discharge: 2019-10-23 | Disposition: A | Discharge status: Home / Self Care | Payer: Managed Care, Other (non HMO) | Attending: Emergency Medicine | Admitting: Emergency Medicine

## 2019-10-23 DIAGNOSIS — R519 Headache, unspecified: Secondary | ICD-10-CM | POA: Diagnosis present

## 2019-10-23 DIAGNOSIS — G44209 Tension-type headache, unspecified, not intractable: Secondary | ICD-10-CM

## 2019-10-23 DIAGNOSIS — M542 Cervicalgia: Secondary | ICD-10-CM | POA: Insufficient documentation

## 2019-10-23 DIAGNOSIS — M549 Dorsalgia, unspecified: Secondary | ICD-10-CM | POA: Diagnosis not present

## 2019-10-23 DIAGNOSIS — J45909 Unspecified asthma, uncomplicated: Secondary | ICD-10-CM | POA: Insufficient documentation

## 2019-10-23 DIAGNOSIS — Z87891 Personal history of nicotine dependence: Secondary | ICD-10-CM | POA: Diagnosis not present

## 2019-10-23 DIAGNOSIS — Z8673 Personal history of transient ischemic attack (TIA), and cerebral infarction without residual deficits: Secondary | ICD-10-CM | POA: Diagnosis not present

## 2019-10-23 DIAGNOSIS — Z79899 Other long term (current) drug therapy: Secondary | ICD-10-CM | POA: Diagnosis not present

## 2019-10-23 MED ORDER — CYCLOBENZAPRINE HCL 10 MG PO TABS: 10.0000 mg | ORAL_TABLET | Freq: Two times a day (BID) | ORAL | 0 refills | Status: DC | PRN

## 2019-10-23 MED ORDER — LIDOCAINE 5 % EX PTCH: 1.0000 | MEDICATED_PATCH | CUTANEOUS | 0 refills | Status: DC

## 2019-10-23 NOTE — ED Provider Notes (Signed)
[facility] EMERGENCY DEPARTMENT Provider Note   CSN: [id number] Arrival date & time: 10/23/19  0505     History Chief Complaint  Patient presents with  . Headache    Marissa Benton is a 44 y.o. female.  Patient presents to the emergency department with a chief complaint of headache.  She reports associated upper neck and upper back pain and muscle tightness.  She states that she has had the symptoms for the past 2 days.  She denies any fevers or chills.  Denies any other associated symptoms.  She has tried using ice and heat with little relief.  She states that she feels like she needs to stay home from work and rest in order to recover.  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  The history is provided by the patient. No language interpreter was used.       Past Medical History:  Diagnosis Date  . Asthma   . Fibromyalgia   . Stroke [facility])     Patient Active Problem List   Diagnosis Date Noted  . Vitamin D deficiency 03/08/2019  . Asthma 01/13/2016  . Neck pain 01/13/2016  . Right cervical radiculopathy 01/13/2016  . 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 History of CVA (cerebrovascular accident) 01/13/2016  . Insomnia 01/13/2016    Past Surgical History:  Procedure Laterality Date  . DILATION AND CURETTAGE OF UTERUS       OB History   No obstetric history on file.     Family History  Problem Relation Age of Onset  . Healthy Mother   . Heart disease Father   . Kidney failure Father   . Neuropathy Father     Social History   Tobacco Use  . Smoking status: Former Smoker    Types: Cigarettes    Quit date: 08/27/2018    Years since quitting: 1.1  . Smokeless tobacco: Never Used  Substance Use Topics  . Alcohol use: Yes    Comment: occasion  . Drug use: No    Home Medications Prior to Admission medications   Medication Sig Start Date End Date Taking?  Authorizing Provider  albuterol (PROVENTIL HFA;VENTOLIN HFA) 108 (90 Base) MCG/ACT inhaler Inhale 1-2 puffs into the lungs every 6 (six) hours as needed for wheezing or shortness of breath. 09/02/18   Scot Jun, FNP  DULoxetine (CYMBALTA) 30 MG capsule Take 1 capsule (30 mg total) by mouth daily. 07/05/19   Tasia Catchings, Amy V, PA-C  pantoprazole (PROTONIX) 40 MG tablet Take 1 tablet (40 mg total) by mouth daily. 09/02/18   Scot Jun, FNP  amitriptyline (ELAVIL) 75 MG tablet Take 1-2 tablets (75-150 mg total) by mouth at bedtime. 03/06/19 07/05/19  Scot Jun, FNP    Allergies    Benadryl [diphenhydramine] and Iodinated diagnostic agents  Review of Systems   Review of Systems  All other systems reviewed and are negative.   Physical Exam Updated Vital Signs BP (!) 137/91 (BP Location: Right Arm)   Pulse 92   Temp 98.4 F (36.9 C) (Oral)   Resp 18   Ht 5\' 2"  (1.575 m)   Wt 91 kg   LMP 10/21/2019   SpO2 98%   BMI 36.69 kg/m   Physical Exam Vitals and nursing note reviewed.  Constitutional:      General: She is not in acute distress.    Appearance: She is well-developed.  HENT:     Head: Normocephalic and atraumatic.  Eyes:     Conjunctiva/sclera: Conjunctivae normal.  Cardiovascular:     Rate and Rhythm: Normal rate.     Heart sounds: No murmur.  Pulmonary:     Effort: Pulmonary effort is normal. No respiratory distress.  Abdominal:     General: There is no distension.  Musculoskeletal:     Cervical back: Neck supple.     Comments: Upper neck and upper back muscles are tight and somewhat tender to palpation  Skin:    General: Skin is warm and dry.  Neurological:     Mental Status: She is alert and oriented to person, place, and time.     Comments: CN III-XII grossly intact, speech is clear, movements are goal oriented, normal gait  Psychiatric:        Mood and Affect: Mood normal.        Behavior: Behavior normal.     ED Results / Procedures /  Treatments   Labs (all labs ordered are listed, but only abnormal results are displayed) Labs Reviewed - No data to display  EKG None  Radiology No results found.  Procedures Procedures (including critical care time)  Medications Ordered in ED Medications - No data to display  ED Course  I have reviewed the triage vital signs and the nursing notes.  Pertinent labs & imaging results that were available during my care of the patient were reviewed by me and considered in my medical decision making (see chart for details).    MDM Rules/Calculators/A&P                      Patient with symptoms that seem consistent with tension headache.  The musculature of her upper back and neck is tender and tight.  She denies any fevers or chills.  She did not have any nuchal rigidity.  She would like a note for work.  I will also write her a prescription for some Flexeril and a Lidoderm patch.  I have encouraged her to continue ice and heat. Final Clinical Impression(s) / ED Diagnoses Final diagnoses:  Tension headache    Rx / DC Orders ED Discharge Orders         Ordered    cyclobenzaprine (FLEXERIL) 10 MG tablet  2 times daily PRN     10/23/19 0556    lidocaine (LIDODERM) 5 %  Every 24 hours     10/23/19 0556           Roxy Horseman, PA-C 10/23/19 0557    Dione Booze, MD 10/23/19 [phone]

## 2019-10-23 NOTE — ED Notes (Signed)
Discharge instructions including pain management discused with pt. Pt verbalized understanding with no questions at this time. Pt to go home with spouse.

## 2019-10-23 NOTE — ED Triage Notes (Signed)
Pt c/o pain to back of head radiating down into back x 2 days, unable to sleep. Denies n/v, denies fever.

## 2020-02-13 ENCOUNTER — Emergency Department (HOSPITAL_COMMUNITY)
Admission: EM | Admit: 2020-02-13 | Discharge: 2020-02-13 | Disposition: A | Discharge status: Home / Self Care | Payer: Managed Care, Other (non HMO) | Attending: Emergency Medicine | Admitting: Emergency Medicine

## 2020-02-13 ENCOUNTER — Encounter (HOSPITAL_COMMUNITY): Payer: Self-pay | Admitting: Emergency Medicine

## 2020-02-13 DIAGNOSIS — J45909 Unspecified asthma, uncomplicated: Secondary | ICD-10-CM | POA: Diagnosis not present

## 2020-02-13 DIAGNOSIS — K0889 Other specified disorders of teeth and supporting structures: Secondary | ICD-10-CM | POA: Diagnosis present

## 2020-02-13 DIAGNOSIS — Z8673 Personal history of transient ischemic attack (TIA), and cerebral infarction without residual deficits: Secondary | ICD-10-CM | POA: Insufficient documentation

## 2020-02-13 DIAGNOSIS — Z87891 Personal history of nicotine dependence: Secondary | ICD-10-CM | POA: Insufficient documentation

## 2020-02-13 DIAGNOSIS — K047 Periapical abscess without sinus: Secondary | ICD-10-CM | POA: Diagnosis not present

## 2020-02-13 DIAGNOSIS — Z79899 Other long term (current) drug therapy: Secondary | ICD-10-CM | POA: Insufficient documentation

## 2020-02-13 MED ORDER — AMOXICILLIN-POT CLAVULANATE 875-125 MG PO TABS: 1.0000 | ORAL_TABLET | Freq: Two times a day (BID) | ORAL | 0 refills | Status: AC

## 2020-02-13 MED ORDER — CHLORHEXIDINE GLUCONATE 0.12 % MT SOLN: 15.0000 mL | Freq: Two times a day (BID) | OROMUCOSAL | 0 refills | Status: AC

## 2020-02-13 MED ORDER — ACETAMINOPHEN 500 MG PO TABS
1000.0000 mg | ORAL_TABLET | Freq: Once | ORAL | Status: AC
  Administered 2020-02-13: 1000 mg via ORAL
  Filled 2020-02-13: qty 2

## 2020-02-13 MED ORDER — AMOXICILLIN-POT CLAVULANATE 875-125 MG PO TABS
1.0000 | ORAL_TABLET | Freq: Once | ORAL | Status: AC
  Administered 2020-02-13: 1 via ORAL
  Filled 2020-02-13: qty 1

## 2020-02-13 MED ORDER — LIDOCAINE VISCOUS HCL 2 % MT SOLN
15.0000 mL | Freq: Once | OROMUCOSAL | Status: AC
  Administered 2020-02-13: 15 mL via OROMUCOSAL
  Filled 2020-02-13: qty 15

## 2020-02-13 NOTE — ED Provider Notes (Signed)
[facility] EMERGENCY DEPARTMENT Provider Note   CSN: [id number] Arrival date & time: 02/13/20  0741     History Chief Complaint  Patient presents with  . Dental Pain  . Abscess    Marissa Benton is a 44 y.o. female.  HPI    Patient presents with concern of mouth pain.  No fever, no vomiting, no dyspnea, no chest pain. She has multiple teeth that are planned for extraction, including several on the right lower area. Now over the past 2 days she has had increasing pain, severe, not improved with ibuprofen in the right posterior jaw.  Pain radiates regionally, is worse with everything.  Patient denies medical problems, notes that she did stop smoking recently, in anticipation of her dental extraction, and upcoming root canal.  Past Medical History:  Diagnosis Date  . Asthma   . Fibromyalgia   . Stroke [facility])     Patient Active Problem List   Diagnosis Date Noted  . Vitamin D deficiency 03/08/2019  . Asthma 01/13/2016  . Neck pain 01/13/2016  . Right cervical radiculopathy 01/13/2016  . 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 History of CVA (cerebrovascular accident) 01/13/2016  . Insomnia 01/13/2016    Past Surgical History:  Procedure Laterality Date  . DILATION AND CURETTAGE OF UTERUS       OB History   No obstetric history on file.     Family History  Problem Relation Age of Onset  . Healthy Mother   . Heart disease Father   . Kidney failure Father   . Neuropathy Father     Social History   Tobacco Use  . Smoking status: Former Smoker    Types: Cigarettes    Quit date: 08/27/2018    Years since quitting: 1.4  . Smokeless tobacco: Never Used  Substance Use Topics  . Alcohol use: Yes    Comment: occasion  . Drug use: No    Home Medications Prior to Admission medications   Medication Sig Start Date End Date Taking? Authorizing Provider  albuterol (PROVENTIL HFA;VENTOLIN HFA) 108 (90 Base) MCG/ACT inhaler Inhale 1-2 puffs into the  lungs every 6 (six) hours as needed for wheezing or shortness of breath. 09/02/18   Bing Neighbors, FNP  amoxicillin-clavulanate (AUGMENTIN) 875-125 MG tablet Take 1 tablet by mouth every 12 (twelve) hours for 5 days. 02/13/20 02/18/20  Gerhard Munch, MD  chlorhexidine (PERIDEX) 0.12 % solution Use as directed 15 mLs in the mouth or throat 2 (two) times daily for 5 days. 02/13/20 02/18/20  Gerhard Munch, MD  cyclobenzaprine (FLEXERIL) 10 MG tablet Take 1 tablet (10 mg total) by mouth 2 (two) times daily as needed for muscle spasms. 10/23/19   Roxy Horseman, PA-C  DULoxetine (CYMBALTA) 30 MG capsule Take 1 capsule (30 mg total) by mouth daily. 07/05/19   Yu, Amy V, PA-C  lidocaine (LIDODERM) 5 % Place 1 patch onto the skin daily. Remove & Discard patch within 12 hours or as directed by MD 10/23/19   Roxy Horseman, PA-C  pantoprazole (PROTONIX) 40 MG tablet Take 1 tablet (40 mg total) by mouth daily. 09/02/18   Bing Neighbors, FNP  amitriptyline (ELAVIL) 75 MG tablet Take 1-2 tablets (75-150 mg total) by mouth at bedtime. 03/06/19 07/05/19  Bing Neighbors, FNP    Allergies    Benadryl [diphenhydramine] and Iodinated diagnostic agents  Review of Systems   Review of Systems  Constitutional:       Per HPI, otherwise negative  HENT:       Per HPI, otherwise negative  Respiratory:       Per HPI, otherwise negative  Cardiovascular:       Per HPI, otherwise negative  Gastrointestinal: Negative for vomiting.  Endocrine:       Negative aside from HPI  Genitourinary:       Neg aside from HPI   Musculoskeletal:       Per HPI, otherwise negative  Skin: Negative.   Neurological: Negative for syncope.    Physical Exam Updated Vital Signs BP (!) 144/90   Pulse 71   Temp 98.5 F (36.9 C) (Oral)   Resp 18   Ht 5\' 2"  (1.575 m)   Wt 84.8 kg   LMP 01/26/2020   SpO2 98%   BMI 34.20 kg/m   Physical Exam Vitals and nursing note reviewed.  Constitutional:      General: She is not  in acute distress.    Appearance: She is well-developed.  HENT:     Head: Normocephalic and atraumatic.     Mouth/Throat:   Eyes:     Conjunctiva/sclera: Conjunctivae normal.  Cardiovascular:     Rate and Rhythm: Normal rate and regular rhythm.  Pulmonary:     Effort: Pulmonary effort is normal. No respiratory distress.     Breath sounds: Normal breath sounds. No stridor.  Abdominal:     General: There is no distension.  Lymphadenopathy:     Head:     Right side of head: Submental adenopathy present.  Skin:    General: Skin is warm and dry.  Neurological:     Mental Status: She is alert and oriented to person, place, and time.     Cranial Nerves: No cranial nerve deficit.     ED Results / Procedures / Treatments    Procedures Procedures (including critical care time)  Medications Ordered in ED Medications  amoxicillin-clavulanate (AUGMENTIN) 875-125 MG per tablet 1 tablet (1 tablet Oral Given 02/13/20 1030)  acetaminophen (TYLENOL) tablet 1,000 mg (1,000 mg Oral Given 02/13/20 1030)  lidocaine (XYLOCAINE) 2 % viscous mouth solution 15 mL (15 mLs Mouth/Throat Given 02/13/20 1031)    ED Course  I have reviewed the triage vital signs and the nursing notes.  Pertinent labs & imaging results that were available during my care of the patient were reviewed by me and considered in my medical decision making (see chart for details).  Generally well-appearing female presents with signs and symptoms consistent with dental infection. No evidence for airway compromise, bacteremia, sepsis, absent fever, distress, difficulty swallowing, speaking, breathing. Patient started on appropriate antibiotics, analgesics, will follow up with her dentist.  Final Clinical Impression(s) / ED Diagnoses Final diagnoses:  Dental infection    Rx / DC Orders ED Discharge Orders         Ordered    amoxicillin-clavulanate (AUGMENTIN) 875-125 MG tablet  Every 12 hours     Discontinue  Reprint      02/13/20 1026    chlorhexidine (PERIDEX) 0.12 % solution  2 times daily     Discontinue  Reprint     02/13/20 1026           Carmin Muskrat, MD 02/13/20 1038

## 2020-02-13 NOTE — Discharge Instructions (Signed)
As discussed, you have been diagnosed with a dental infection.  Please obtain and use the prescribed antibiotics and antiseptic mouth rinse.  Please use Tylenol, 650 mg, 3 times daily and ibuprofen, 400 mg, 3 times daily for additional pain relief.  Return here for concerning changes in your condition.

## 2020-02-13 NOTE — ED Triage Notes (Signed)
Pt. Stated, I have a bad tooth that has become infected on the right side, bottom.

## 2020-03-02 ENCOUNTER — Other Ambulatory Visit: Payer: Self-pay

## 2020-03-02 ENCOUNTER — Ambulatory Visit
Admission: EM | Admit: 2020-03-02 | Discharge: 2020-03-02 | Disposition: A | Discharge status: Home / Self Care | Payer: Managed Care, Other (non HMO) | Attending: Emergency Medicine | Admitting: Emergency Medicine

## 2020-03-02 ENCOUNTER — Encounter: Payer: Self-pay | Admitting: Emergency Medicine

## 2020-03-02 DIAGNOSIS — J069 Acute upper respiratory infection, unspecified: Secondary | ICD-10-CM

## 2020-03-02 MED ORDER — BENZONATATE 100 MG PO CAPS: 100.0000 mg | ORAL_CAPSULE | Freq: Three times a day (TID) | ORAL | 0 refills | Status: DC

## 2020-03-02 MED ORDER — ALBUTEROL SULFATE HFA 108 (90 BASE) MCG/ACT IN AERS: 1.0000 | INHALATION_SPRAY | Freq: Four times a day (QID) | RESPIRATORY_TRACT | 0 refills | Status: AC | PRN

## 2020-03-02 MED ORDER — AEROCHAMBER PLUS FLO-VU MEDIUM MISC: 1.0000 | Freq: Once | 0 refills | Status: AC

## 2020-03-02 MED ORDER — FLUTICASONE PROPIONATE 50 MCG/ACT NA SUSP: 1.0000 | Freq: Every day | NASAL | 0 refills | Status: DC

## 2020-03-02 MED ORDER — CETIRIZINE HCL 10 MG PO TABS: 10.0000 mg | ORAL_TABLET | Freq: Every day | ORAL | 0 refills | Status: DC

## 2020-03-02 NOTE — Discharge Instructions (Signed)

## 2020-03-02 NOTE — ED Triage Notes (Signed)
Pt presents to [facility] for assessment of cough, sore throat, and nasal congestion x 3 days, not relieved with OTC medicines.  Denies fevers. c/o fatigue, and body aches.

## 2020-03-02 NOTE — ED Provider Notes (Signed)
[name]    CSN: [id number] Arrival date & time: 03/02/20  0934      History   Chief Complaint Chief Complaint  Patient presents with  . URI    HPI Marissa Benton is a 44 y.o. female with history of fibromyalgia, stroke, asthma presenting for 3-day course of URI symptoms times.  Patient endorsing cough and sore throat and nasal congestion head she has tried OTC meds without relief.  No fevers or difficulty breathing or swallowing and she has no chest pain or shortness of breath.   Past Medical History:  Diagnosis Date  . Asthma   . Fibromyalgia   . Stroke [facility])     Patient Active Problem List   Diagnosis Date Noted  . Vitamin D deficiency 03/08/2019  . Asthma 01/13/2016  . Neck pain 01/13/2016  . Right cervical radiculopathy 01/13/2016  . 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 History of CVA (cerebrovascular accident) 01/13/2016  . Insomnia 01/13/2016    Past Surgical History:  Procedure Laterality Date  . DILATION AND CURETTAGE OF UTERUS      OB History   No obstetric history on file.      Home Medications    Prior to Admission medications   Medication Sig Start Date End Date Taking? Authorizing Provider  albuterol (VENTOLIN HFA) 108 (90 Base) MCG/ACT inhaler Inhale 1-2 puffs into the lungs every 6 (six) hours as needed for wheezing or shortness of breath. 03/02/20   Hall-Potvin, [place], PA-C  benzonatate (TESSALON) 100 MG capsule Take 1 capsule (100 mg total) by mouth every 8 (eight) hours. 03/02/20   Hall-Potvin, [place], PA-C  cetirizine (ZYRTEC ALLERGY) 10 MG tablet Take 1 tablet (10 mg total) by mouth daily. 03/02/20   Hall-Potvin, [place], PA-C  fluticasone (FLONASE) 50 MCG/ACT nasal spray Place 1 spray into both nostrils daily. 03/02/20   Hall-Potvin, [place], PA-C  lidocaine (LIDODERM) 5 % Place 1 patch onto the skin daily. Remove & Discard patch within 12 hours or as directed by MD 10/23/19   Roxy Horseman, PA-C  Spacer/Aero-Holding Chambers  (AEROCHAMBER PLUS FLO-VU MEDIUM) MISC 1 each by Other route once for 1 dose. 03/02/20 03/02/20  Hall-Potvin, [place], PA-C  amitriptyline (ELAVIL) 75 MG tablet Take 1-2 tablets (75-150 mg total) by mouth at bedtime. 03/06/19 07/05/19  Bing Neighbors, FNP  DULoxetine (CYMBALTA) 30 MG capsule Take 1 capsule (30 mg total) by mouth daily. 07/05/19 03/02/20  Cathie Hoops, Amy V, PA-C  pantoprazole (PROTONIX) 40 MG tablet Take 1 tablet (40 mg total) by mouth daily. 09/02/18 03/02/20  Bing Neighbors, FNP    Family History Family History  Problem Relation Age of Onset  . Healthy Mother   . Heart disease Father   . Kidney failure Father   . Neuropathy Father     Social History Social History   Tobacco Use  . Smoking status: Former Smoker    Types: Cigarettes    Quit date: 08/27/2018    Years since quitting: 1.5  . Smokeless tobacco: Never Used  Substance Use Topics  . Alcohol use: Yes    Comment: occasion  . Drug use: No     Allergies   Benadryl [diphenhydramine] and Iodinated diagnostic agents   Review of Systems As per HPI   Physical Exam Triage Vital Signs ED Triage Vitals  Enc Vitals Group     BP      Pulse      Resp      Temp  Temp src      SpO2      Weight      Height      Head Circumference      Peak Flow      Pain Score      Pain Loc      Pain Edu?      Excl. in GC?    No data found.  Updated Vital Signs BP 124/88 (BP Location: Left Arm)   Pulse 70   Temp 98.8 F (37.1 C) (Oral)   Resp 18   LMP 02/29/2020   SpO2 97%   Visual Acuity Right Eye Distance:   Left Eye Distance:   Bilateral Distance:    Right Eye Near:   Left Eye Near:    Bilateral Near:     Physical Exam Constitutional:      General: She is not in acute distress.    Appearance: She is obese. She is not ill-appearing or diaphoretic.  HENT:     Head: Normocephalic and atraumatic.     Mouth/Throat:     Mouth: Mucous membranes are moist.     Pharynx: Oropharynx is clear. No  oropharyngeal exudate or posterior oropharyngeal erythema.  Eyes:     General: No scleral icterus.    Conjunctiva/sclera: Conjunctivae normal.     Pupils: Pupils are equal, round, and reactive to light.  Neck:     Comments: Trachea midline, negative JVD Cardiovascular:     Rate and Rhythm: Normal rate and regular rhythm.     Heart sounds: No murmur heard.  No gallop.   Pulmonary:     Effort: Pulmonary effort is normal. No respiratory distress.     Breath sounds: No wheezing, rhonchi or rales.  Musculoskeletal:     Cervical back: Neck supple. No tenderness.  Lymphadenopathy:     Cervical: No cervical adenopathy.  Skin:    Capillary Refill: Capillary refill takes less than 2 seconds.     Coloration: Skin is not jaundiced or pale.     Findings: No rash.  Neurological:     General: No focal deficit present.     Mental Status: She is alert and oriented to person, place, and time.      UC Treatments / Results  Labs (all labs ordered are listed, but only abnormal results are displayed) Labs Reviewed  NOVEL CORONAVIRUS, NAA    EKG   Radiology No results found.  Procedures Procedures (including critical care time)  Medications Ordered in UC Medications - No data to display  Initial Impression / Assessment and Plan / UC Course  I have reviewed the triage vital signs and the nursing notes.  Pertinent labs & imaging results that were available during my care of the patient were reviewed by me and considered in my medical decision making (see chart for details).     Patient afebrile, nontoxic, with SpO2 97%.  Covid PCR pending.  Patient to quarantine until results are back.  We will treat supportively as outlined below.  Return precautions discussed, patient verbalized understanding and is agreeable to plan. Final Clinical Impressions(s) / UC Diagnoses   Final diagnoses:  URI with cough and congestion     Discharge Instructions     Tessalon for cough. Start  flonase, atrovent nasal spray for nasal congestion/drainage. You can use over the counter nasal saline rinse such as neti pot for nasal congestion. Keep hydrated, your urine should be clear to pale yellow in color. Tylenol/motrin for fever and pain.  Monitor for any worsening of symptoms, chest pain, shortness of breath, wheezing, swelling of the throat, go to the emergency department for further evaluation needed.     ED Prescriptions    Medication Sig Dispense Auth. Provider   albuterol (VENTOLIN HFA) 108 (90 Base) MCG/ACT inhaler Inhale 1-2 puffs into the lungs every 6 (six) hours as needed for wheezing or shortness of breath. 18 g Hall-Potvin, [place], PA-C   Spacer/Aero-Holding Chambers (AEROCHAMBER PLUS FLO-VU MEDIUM) MISC 1 each by Other route once for 1 dose. 1 each Hall-Potvin, [place], PA-C   benzonatate (TESSALON) 100 MG capsule Take 1 capsule (100 mg total) by mouth every 8 (eight) hours. 21 capsule Hall-Potvin, [place], PA-C   cetirizine (ZYRTEC ALLERGY) 10 MG tablet Take 1 tablet (10 mg total) by mouth daily. 30 tablet Hall-Potvin, [place], PA-C   fluticasone (FLONASE) 50 MCG/ACT nasal spray Place 1 spray into both nostrils daily. 16 g Hall-Potvin, [place], PA-C     PDMP not reviewed this encounter.   Hall-Potvin, [place], [place] 03/02/20 1052

## 2020-03-03 LAB — SARS-COV-2, NAA 2 DAY TAT

## 2020-03-03 LAB — NOVEL CORONAVIRUS, NAA: SARS-CoV-2, NAA: NOT DETECTED

## 2020-06-24 ENCOUNTER — Emergency Department (HOSPITAL_COMMUNITY)
Admission: EM | Admit: 2020-06-24 | Discharge: 2020-06-24 | Disposition: A | Discharge status: Home / Self Care | Payer: Managed Care, Other (non HMO) | Attending: Emergency Medicine | Admitting: Emergency Medicine

## 2020-06-24 ENCOUNTER — Other Ambulatory Visit: Payer: Self-pay

## 2020-06-24 ENCOUNTER — Encounter (HOSPITAL_COMMUNITY): Payer: Self-pay | Admitting: Emergency Medicine

## 2020-06-24 ENCOUNTER — Emergency Department (HOSPITAL_COMMUNITY): Payer: Managed Care, Other (non HMO)

## 2020-06-24 DIAGNOSIS — Z87891 Personal history of nicotine dependence: Secondary | ICD-10-CM | POA: Insufficient documentation

## 2020-06-24 DIAGNOSIS — M79672 Pain in left foot: Secondary | ICD-10-CM | POA: Diagnosis not present

## 2020-06-24 DIAGNOSIS — J45909 Unspecified asthma, uncomplicated: Secondary | ICD-10-CM | POA: Insufficient documentation

## 2020-06-24 NOTE — Discharge Instructions (Addendum)
Seen here for foot pain.  Lab work and imaging looks reassuring.  I recommend taking over-the-counter Tylenol every 6 as needed.  Please follow dosing the back of bottle.  Also recommend keeping the foot elevated and applying ice to the areas as well decrease inflammation and swelling.  You may also apply brace to this to help give it some support.  I recommend wearing the brace for no more than 1 week in duration and wear it during walking activities.  Please do not wear at  nighttime.  If symptoms persist after 1 week's time on follow-up with your orthopedic doctor for further evaluation management.  Come back to the emergency department if you develop chest pain, shortness of breath, severe abdominal pain, uncontrolled nausea, vomiting, diarrhea.

## 2020-06-24 NOTE — ED Triage Notes (Signed)
Pt here with left foot pain , no trauma noted , foot is slightly swollen in the arch

## 2020-06-24 NOTE — ED Provider Notes (Signed)
[facility] EMERGENCY DEPARTMENT Provider Note   CSN: [id number] Arrival date & time: 06/24/20  1309     History No chief complaint on file.   Marissa Benton is a 44 y.o. female.  HPI   Patient with significant medical history of fibromyalgia presents to the emergency department with chief complaint of left foot pain.  Patient states the pain started yesterday and has progressed gotten worse.  She states the pain is on the lateral aspect of her fifth metatarsal.  She denies any trauma to the area, no history of gout or arthritis.  She endorses the pain is worse when she places  weight on it and describes as a sharp sensation.  When she is not walking and resting the pain goes away.  She has been taking Tylenol without any significant relief.  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  Patient denies IV drug use, denies rash or fevers, denies red hot swollen joints.  She denies headache, fever, chills, shortness of breath, chest pain, dumping, nausea, vomiting, diarrhea and pedal edema.  Past Medical History:  Diagnosis Date  . Asthma   . Fibromyalgia   . Stroke [facility])     Patient Active Problem List   Diagnosis Date Noted  . Vitamin D deficiency 03/08/2019  . Asthma 01/13/2016  . Neck pain 01/13/2016  . Right cervical radiculopathy 01/13/2016  . 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 History of CVA (cerebrovascular accident) 01/13/2016  . Insomnia 01/13/2016    Past Surgical History:  Procedure Laterality Date  . DILATION AND CURETTAGE OF UTERUS       OB History   No obstetric history on file.     Family History  Problem Relation Age of Onset  . Healthy Mother   . Heart disease Father   . Kidney failure Father   . Neuropathy Father     Social History   Tobacco Use  . Smoking status: Former Smoker    Types: Cigarettes    Quit date: 08/27/2018    Years since quitting: 1.8  . Smokeless tobacco: Never Used  Substance  Use Topics  . Alcohol use: Yes    Comment: occasion  . Drug use: No    Home Medications Prior to Admission medications   Medication Sig Start Date End Date Taking? Authorizing Provider  albuterol (VENTOLIN HFA) 108 (90 Base) MCG/ACT inhaler Inhale 1-2 puffs into the lungs every 6 (six) hours as needed for wheezing or shortness of breath. 03/02/20   Hall-Potvin, [place], PA-C  benzonatate (TESSALON) 100 MG capsule Take 1 capsule (100 mg total) by mouth every 8 (eight) hours. 03/02/20   Hall-Potvin, [place], PA-C  cetirizine (ZYRTEC ALLERGY) 10 MG tablet Take 1 tablet (10 mg total) by mouth daily. 03/02/20   Hall-Potvin, [place], PA-C  fluticasone (FLONASE) 50 MCG/ACT nasal spray Place 1 spray into both nostrils daily. 03/02/20   Hall-Potvin, [place], PA-C  lidocaine (LIDODERM) 5 % Place 1 patch onto the skin daily. Remove & Discard patch within 12 hours or as directed by MD 10/23/19   Roxy Horseman, PA-C  amitriptyline (ELAVIL) 75 MG tablet Take 1-2 tablets (75-150 mg total) by mouth at bedtime. 03/06/19 07/05/19  Bing Neighbors, FNP  DULoxetine (CYMBALTA) 30 MG capsule Take 1 capsule (30 mg total) by mouth daily. 07/05/19 03/02/20  Cathie Hoops, Amy V, PA-C  pantoprazole (PROTONIX) 40 MG tablet Take 1 tablet (40 mg total) by mouth daily. 09/02/18 03/02/20  Tiburcio Pea,  Godfrey Pick, FNP    Allergies    Benadryl [diphenhydramine] and Iodinated diagnostic agents  Review of Systems   Review of Systems  Constitutional: Negative for chills and fever.  HENT: Negative for congestion, tinnitus, trouble swallowing and voice change.   Eyes: Negative for visual disturbance.  Respiratory: Negative for shortness of breath.   Cardiovascular: Negative for chest pain.  Gastrointestinal: Negative for abdominal pain, diarrhea, nausea and vomiting.  Genitourinary: Negative for enuresis and flank pain.  Musculoskeletal: Negative for back pain.       Left foot pain.  Skin: Negative for rash.  Neurological: Negative for  dizziness and headaches.  Hematological: Does not bruise/bleed easily.    Physical Exam Updated Vital Signs BP (!) 143/98 (BP Location: Right Arm)   Pulse 83   Temp 98.4 F (36.9 C) (Oral)   Resp 18   Ht 5\' 2"  (1.575 m)   Wt 84.8 kg   SpO2 100%   BMI 34.19 kg/m   Physical Exam Vitals and nursing note reviewed.  Constitutional:      General: She is not in acute distress.    Appearance: Normal appearance. She is not ill-appearing or diaphoretic.  HENT:     Head: Normocephalic and atraumatic.     Nose: No congestion or rhinorrhea.  Eyes:     General: No scleral icterus.       Right eye: No discharge.        Left eye: No discharge.     Conjunctiva/sclera: Conjunctivae normal.  Cardiovascular:     Pulses: Normal pulses.     Heart sounds: Normal heart sounds. No murmur heard.  No friction rub. No gallop.   Pulmonary:     Effort: Pulmonary effort is normal. No respiratory distress.     Breath sounds: Normal breath sounds. No wheezing.  Musculoskeletal:        General: Tenderness present. No swelling.     Cervical back: Neck supple.     Right lower leg: No edema.     Left lower leg: No edema.     Comments: Patient's foot was evaluated, no gross abnormalities noted, it was not erythematous, nonswollen.  Patient full range of motion as well as 5 of 5 strength, neurovascular fully intact in the right foot.  It was tender to palpation along the lateral aspect of her fifth metatarsal.  No gross abnormalities palpated.    Skin:    General: Skin is warm and dry.     Coloration: Skin is not jaundiced or pale.     Findings: No lesion or rash.     Comments: Skin exam was performed no rashes, lesions, ecchymosis, erythematous, swollen joints noted on exam.  Neurological:     Mental Status: She is alert and oriented to person, place, and time.  Psychiatric:        Mood and Affect: Mood normal.     ED Results / Procedures / Treatments   Labs (all labs ordered are listed, but  only abnormal results are displayed) Labs Reviewed - No data to display  EKG None  Radiology DG Foot Complete Left  Result Date: 06/24/2020 CLINICAL DATA:  Foot pain and swelling for 1 day. No reported injury EXAM: LEFT FOOT - COMPLETE 3+ VIEW COMPARISON:  None. FINDINGS: There is no evidence of fracture or dislocation. There is no evidence of arthropathy or other focal bone abnormality. Small plantar calcaneal spur. Soft tissues are unremarkable. IMPRESSION: Negative. Electronically Signed   By: 06/26/2020  D.O.   On: 06/24/2020 13:58    Procedures Procedures (including critical care time)  Medications Ordered in ED Medications - No data to display  ED Course  I have reviewed the triage vital signs and the nursing notes.  Pertinent labs & imaging results that were available during my care of the patient were reviewed by me and considered in my medical decision making (see chart for details).    MDM Rules/Calculators/A&P                          I have personally reviewed all imaging, labs and have interpreted them.  Patient presents with left foot pain.  She is alert, did not appear in acute distress, vital signs reassuring.  Will order x-ray for further evaluation.  Left foot x-ray does not reveal any acute abnormalities.  I have low suspicion for septic arthritis as patient denies IV drug use, skin exam was performed no erythematous, edematous, warm joints noted on exam, no new heart murmur heard on exam.  Low suspicion for fracture or dislocation as x-ray does not feel any significant findings. low suspicion for ligament or tendon damage as area was palpated no gross defects noted, they had full range of motion as well as 5/5 strength.  Low suspicion for compartment syndrome as area was palpated it was soft to the touch, neurovascular fully intact.  I suspect patient may some inflammation at the proximal joint of her fifth metatarsal possibly due to overuse.  Will recommend  over-the-counter pain medications, rice and elevation.  Have her follow-up with Ortho if symptoms not fully resolved.  Vital signs have remained stable, no indication for hospital admission.    Patient given at home care as well strict return precautions.  Patient verbalized that they understood agreed to said plan.   Final Clinical Impression(s) / ED Diagnoses Final diagnoses:  Left foot pain    Rx / DC Orders ED Discharge Orders    None       Carroll Sage, PA-C 06/24/20 1424    Arby Barrette, MD 06/25/20 1145

## 2020-08-20 ENCOUNTER — Other Ambulatory Visit: Payer: Self-pay

## 2020-08-20 ENCOUNTER — Emergency Department (HOSPITAL_COMMUNITY)
Admission: EM | Admit: 2020-08-20 | Discharge: 2020-08-20 | Disposition: A | Discharge status: Home / Self Care | Payer: Self-pay | Attending: Emergency Medicine | Admitting: Emergency Medicine

## 2020-08-20 ENCOUNTER — Emergency Department (HOSPITAL_COMMUNITY): Payer: Self-pay

## 2020-08-20 ENCOUNTER — Encounter (HOSPITAL_COMMUNITY): Payer: Self-pay

## 2020-08-20 DIAGNOSIS — Z5321 Procedure and treatment not carried out due to patient leaving prior to being seen by health care provider: Secondary | ICD-10-CM | POA: Insufficient documentation

## 2020-08-20 DIAGNOSIS — R0789 Other chest pain: Secondary | ICD-10-CM | POA: Insufficient documentation

## 2020-08-20 LAB — BASIC METABOLIC PANEL
Anion gap: 10 (ref 5–15)
BUN: 11 mg/dL (ref 6–20)
CO2: 22 mmol/L (ref 22–32)
Calcium: 9.2 mg/dL (ref 8.9–10.3)
Chloride: 105 mmol/L (ref 98–111)
Creatinine, Ser: 0.83 mg/dL (ref 0.44–1.00)
GFR, Estimated: >60 mL/min (ref >60)
Glucose, Bld: 117 mg/dL — ABNORMAL HIGH (ref 70–99)
Potassium: 4.2 mmol/L (ref 3.5–5.1)
Sodium: 137 mmol/L (ref 135–145)

## 2020-08-20 LAB — CBC
HCT: 37.5 % (ref 36.0–46.0)
Hemoglobin: 12.6 g/dL (ref 12.0–15.0)
MCH: 34.6 pg — ABNORMAL HIGH (ref 26.0–34.0)
MCHC: 33.6 g/dL (ref 30.0–36.0)
MCV: 103 fL — ABNORMAL HIGH (ref 80.0–100.0)
Platelets: 267 10*3/uL (ref 150–400)
RBC: 3.64 MIL/uL — ABNORMAL LOW (ref 3.87–5.11)
RDW: 12 % (ref 11.5–15.5)
WBC: 8.4 10*3/uL (ref 4.0–10.5)
nRBC: 0 % (ref 0.0–0.2)

## 2020-08-20 LAB — TROPONIN I (HIGH SENSITIVITY)
Troponin I (High Sensitivity): 3 ng/L (ref <18)
Troponin I (High Sensitivity): 2 ng/L (ref <18)

## 2020-08-20 LAB — I-STAT BETA HCG BLOOD, ED (MC, WL, AP ONLY): I-stat hCG, quantitative: <5 m[IU]/mL (ref <5)

## 2020-08-20 NOTE — ED Triage Notes (Signed)
Patient complains of left anterior chest pain since awakening this am. Denies any respiratory illness. Pain nol worse with movement

## 2020-08-20 NOTE — ED Notes (Signed)
Pt stated that they were leaving  

## 2020-08-21 ENCOUNTER — Emergency Department (HOSPITAL_COMMUNITY)
Admission: EM | Admit: 2020-08-21 | Discharge: 2020-08-21 | Disposition: A | Discharge status: Home / Self Care | Payer: Self-pay | Attending: Emergency Medicine | Admitting: Emergency Medicine

## 2020-08-21 ENCOUNTER — Encounter (HOSPITAL_COMMUNITY): Payer: Self-pay | Admitting: Emergency Medicine

## 2020-08-21 ENCOUNTER — Emergency Department (HOSPITAL_COMMUNITY): Payer: Self-pay

## 2020-08-21 DIAGNOSIS — R079 Chest pain, unspecified: Secondary | ICD-10-CM

## 2020-08-21 DIAGNOSIS — J45909 Unspecified asthma, uncomplicated: Secondary | ICD-10-CM | POA: Insufficient documentation

## 2020-08-21 DIAGNOSIS — R519 Headache, unspecified: Secondary | ICD-10-CM | POA: Insufficient documentation

## 2020-08-21 DIAGNOSIS — M5412 Radiculopathy, cervical region: Secondary | ICD-10-CM

## 2020-08-21 DIAGNOSIS — Z87891 Personal history of nicotine dependence: Secondary | ICD-10-CM | POA: Insufficient documentation

## 2020-08-21 DIAGNOSIS — R0789 Other chest pain: Secondary | ICD-10-CM | POA: Insufficient documentation

## 2020-08-21 DIAGNOSIS — Z20822 Contact with and (suspected) exposure to covid-19: Secondary | ICD-10-CM | POA: Insufficient documentation

## 2020-08-21 DIAGNOSIS — M542 Cervicalgia: Secondary | ICD-10-CM | POA: Insufficient documentation

## 2020-08-21 LAB — CBC WITH DIFFERENTIAL/PLATELET
Abs Immature Granulocytes: 0.04 10*3/uL (ref 0.00–0.07)
Basophils Absolute: 0 10*3/uL (ref 0.0–0.1)
Basophils Relative: 0 %
Eosinophils Absolute: 0.1 10*3/uL (ref 0.0–0.5)
Eosinophils Relative: 1 %
HCT: 39.5 % (ref 36.0–46.0)
Hemoglobin: 12.3 g/dL (ref 12.0–15.0)
Immature Granulocytes: 0 %
Lymphocytes Relative: 22 %
Lymphs Abs: 2.1 10*3/uL (ref 0.7–4.0)
MCH: 32.5 pg (ref 26.0–34.0)
MCHC: 31.1 g/dL (ref 30.0–36.0)
MCV: 104.2 fL — ABNORMAL HIGH (ref 80.0–100.0)
Monocytes Absolute: 0.6 10*3/uL (ref 0.1–1.0)
Monocytes Relative: 6 %
Neutro Abs: 6.5 10*3/uL (ref 1.7–7.7)
Neutrophils Relative %: 71 %
Platelets: 265 10*3/uL (ref 150–400)
RBC: 3.79 MIL/uL — ABNORMAL LOW (ref 3.87–5.11)
RDW: 12.1 % (ref 11.5–15.5)
WBC: 9.3 10*3/uL (ref 4.0–10.5)
nRBC: 0 % (ref 0.0–0.2)

## 2020-08-21 LAB — BASIC METABOLIC PANEL
Anion gap: 8 (ref 5–15)
BUN: 11 mg/dL (ref 6–20)
CO2: 24 mmol/L (ref 22–32)
Calcium: 9.1 mg/dL (ref 8.9–10.3)
Chloride: 106 mmol/L (ref 98–111)
Creatinine, Ser: 0.76 mg/dL (ref 0.44–1.00)
GFR, Estimated: >60 mL/min (ref >60)
Glucose, Bld: 86 mg/dL (ref 70–99)
Potassium: 3.9 mmol/L (ref 3.5–5.1)
Sodium: 138 mmol/L (ref 135–145)

## 2020-08-21 LAB — RESP PANEL BY RT-PCR (FLU A&B, COVID) ARPGX2
Influenza A by PCR: NEGATIVE
Influenza B by PCR: NEGATIVE
SARS Coronavirus 2 by RT PCR: NEGATIVE

## 2020-08-21 LAB — TROPONIN I (HIGH SENSITIVITY): Troponin I (High Sensitivity): <2 ng/L (ref <18)

## 2020-08-21 MED ORDER — KETOROLAC TROMETHAMINE 30 MG/ML IJ SOLN
30.0000 mg | Freq: Once | INTRAMUSCULAR | Status: AC
  Administered 2020-08-21: 30 mg via INTRAVENOUS
  Filled 2020-08-21: qty 1

## 2020-08-21 MED ORDER — PREDNISONE 10 MG PO TABS: ORAL_TABLET | ORAL | 0 refills | Status: AC

## 2020-08-21 MED ORDER — CYCLOBENZAPRINE HCL 5 MG PO TABS: 5.0000 mg | ORAL_TABLET | Freq: Three times a day (TID) | ORAL | 0 refills | Status: DC | PRN

## 2020-08-21 MED ORDER — SODIUM CHLORIDE 0.9 % IV BOLUS
1000.0000 mL | Freq: Once | INTRAVENOUS | Status: AC
  Administered 2020-08-21: 1000 mL via INTRAVENOUS

## 2020-08-21 MED ORDER — PROMETHAZINE HCL 25 MG/ML IJ SOLN
12.5000 mg | Freq: Once | INTRAMUSCULAR | Status: AC
  Administered 2020-08-21: 22:00:00 12.5 mg via INTRAVENOUS
  Filled 2020-08-21: qty 1

## 2020-08-21 NOTE — ED Triage Notes (Signed)
Pt states she left earlier this morning after waiting in the waiting room over night, was here for chest pain last night. C/o ongoing chest pain and headache now.

## 2020-08-21 NOTE — Discharge Instructions (Signed)
Take prednisone as prescribed and flexeril for muscle spasms   You have a pinched nerve in your neck. See neurosurgery for follow up   Your heart enzymes are normal right now   See your primary care doctor for follow up   Your COVID test is pending and if you are COVID positive, you will need to stay home for 10 days   Return to ER if you have worse chest pain, neck pain, numbness, weakness

## 2020-08-21 NOTE — ED Provider Notes (Signed)
[facility] EMERGENCY DEPARTMENT Provider Note   CSN: [id number] Arrival date & time: 08/21/20  8676     History Chief Complaint  Patient presents with  . Headache  . Chest Pain    Pierce Biagini is a 44 y.o. female history of fibromyalgia, cervical radiculopathy here presenting with neck pain and headache and chest pain.  Patient came in yesterday for left-sided chest pain.  Patient had 1 - troponin and left without being seen.  She states that she took some Tylenol and the pain came back on the left side of her chest.  She also has some right-sided neck pain and headache as well.  Denies any neck stiffness.  Denies any fevers.  Patient is fully vaccinated against COVID.  The history is provided by the patient.       Past Medical History:  Diagnosis Date  . Asthma   . Fibromyalgia   . Stroke [facility])     Patient Active Problem List   Diagnosis Date Noted  . Vitamin D deficiency 03/08/2019  . Asthma 01/13/2016  . Neck pain 01/13/2016  . Right cervical radiculopathy 01/13/2016  . 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 History of CVA (cerebrovascular accident) 01/13/2016  . Insomnia 01/13/2016    Past Surgical History:  Procedure Laterality Date  . DILATION AND CURETTAGE OF UTERUS       OB History   No obstetric history on file.     Family History  Problem Relation Age of Onset  . Healthy Mother   . Heart disease Father   . Kidney failure Father   . Neuropathy Father     Social History   Tobacco Use  . Smoking status: Former Smoker    Types: Cigarettes    Quit date: 08/27/2018    Years since quitting: 1.9  . Smokeless tobacco: Never Used  Substance Use Topics  . Alcohol use: Yes    Comment: occasion  . Drug use: No    Home Medications Prior to Admission medications   Medication Sig Start Date End Date Taking? Authorizing Provider  albuterol (VENTOLIN HFA) 108 (90 Base) MCG/ACT inhaler Inhale 1-2 puffs into the lungs every 6 (six)  hours as needed for wheezing or shortness of breath. 03/02/20   Hall-Potvin, [place], PA-C  benzonatate (TESSALON) 100 MG capsule Take 1 capsule (100 mg total) by mouth every 8 (eight) hours. 03/02/20   Hall-Potvin, [place], PA-C  cetirizine (ZYRTEC ALLERGY) 10 MG tablet Take 1 tablet (10 mg total) by mouth daily. 03/02/20   Hall-Potvin, [place], PA-C  fluticasone (FLONASE) 50 MCG/ACT nasal spray Place 1 spray into both nostrils daily. 03/02/20   Hall-Potvin, [place], PA-C  lidocaine (LIDODERM) 5 % Place 1 patch onto the skin daily. Remove & Discard patch within 12 hours or as directed by MD 10/23/19   Roxy Horseman, PA-C  amitriptyline (ELAVIL) 75 MG tablet Take 1-2 tablets (75-150 mg total) by mouth at bedtime. 03/06/19 07/05/19  Bing Neighbors, FNP  DULoxetine (CYMBALTA) 30 MG capsule Take 1 capsule (30 mg total) by mouth daily. 07/05/19 03/02/20  Cathie Hoops, Amy V, PA-C  pantoprazole (PROTONIX) 40 MG tablet Take 1 tablet (40 mg total) by mouth daily. 09/02/18 03/02/20  Bing Neighbors, FNP    Allergies    Benadryl [diphenhydramine] and Iodinated diagnostic agents  Review of Systems   Review of Systems  Cardiovascular: Positive for chest pain.  Neurological: Positive for headaches.  All other systems reviewed and are negative.   Physical Exam Updated  Vital Signs BP 137/88   Pulse 97   Temp 98.8 F (37.1 C) (Oral)   Resp 18   SpO2 98%   Physical Exam Vitals and nursing note reviewed.  Constitutional:      Comments: Slightly uncomfortable.  HENT:     Head: Normocephalic.     Mouth/Throat:     Mouth: Mucous membranes are moist.  Eyes:     Extraocular Movements: Extraocular movements intact.     Pupils: Pupils are equal, round, and reactive to light.  Neck:     Comments: Mild right para cervical tenderness Cardiovascular:     Rate and Rhythm: Normal rate and regular rhythm.     Heart sounds: Normal heart sounds.  Pulmonary:     Effort: Pulmonary effort is normal.     Breath sounds:  Normal breath sounds.     Comments: Mild reproducible left chest wall tenderness Abdominal:     General: Bowel sounds are normal.     Palpations: Abdomen is soft.  Musculoskeletal:        General: Normal range of motion.  Skin:    General: Skin is warm.  Neurological:     Mental Status: She is alert and oriented to person, place, and time.     Cranial Nerves: No cranial nerve deficit or dysarthria.     Sensory: No sensory deficit.     Motor: No weakness.     Coordination: Romberg sign negative.  Psychiatric:        Mood and Affect: Mood normal.        Behavior: Behavior normal.     ED Results / Procedures / Treatments   Labs (all labs ordered are listed, but only abnormal results are displayed) Labs Reviewed  CBC WITH DIFFERENTIAL/PLATELET - Abnormal; Notable for the following components:      Result Value   RBC 3.79 (*)    MCV 104.2 (*)    All other components within normal limits  RESP PANEL BY RT-PCR (FLU A&B, COVID) ARPGX2  BASIC METABOLIC PANEL  TROPONIN I (HIGH SENSITIVITY)    EKG EKG Interpretation  Date/Time:  Wednesday August 21 2020 08:39:23 EST Ventricular Rate:  76 PR Interval:  130 QRS Duration: 72 QT Interval:  368 QTC Calculation: 414 R Axis:   52 Text Interpretation: Normal sinus rhythm with sinus arrhythmia Normal ECG No significant change since last tracing Confirmed by Richardean Canal [phone]) on 08/21/2020 8:38:53 PM   Radiology DG Chest 2 View  Result Date: 08/20/2020 CLINICAL DATA:  Short of breath EXAM: CHEST - 2 VIEW COMPARISON:  08/27/2018 FINDINGS: The heart size and mediastinal contours are within normal limits. Both lungs are clear. The visualized skeletal structures are unremarkable. IMPRESSION: No active cardiopulmonary disease. Electronically Signed   By: Sharlet Salina M.D.   On: 08/20/2020 17:08   DG Cervical Spine Complete  Result Date: 08/21/2020 CLINICAL DATA:  Neck pain EXAM: CERVICAL SPINE - COMPLETE 4+ VIEW COMPARISON:   Radiograph 01/01/2016 FINDINGS: Reversal the normal cervical lordosis is similar to comparison imaging. No spondylolisthesis. No abnormally widened, perched or jumped facets. Dens is intact. No acute fracture or vertebral body height loss. There has been progressive intervertebral disc height loss and discogenic change most evident at the C5-6 level. Some uncinate spurring and facet degenerative changes are present as well resulting in some bony neural foraminal narrowing most pronounced C5-6 into a lesser extent C3-C5 as well. No prevertebral gas or swelling. No acute abnormality in the upper chest or  imaged lung apices. IMPRESSION: 1. No acute fracture or listhesis. 2. Straightening and reversal the cervical lordosis, possibly chronic and degenerative given in unchanged appearance from prior though can be seen in the setting of muscle spasm. 3. Progressive degenerative disc disease and facet degenerative changes most pronounced at C5-6 including some likely foraminal narrowing on the right. Electronically Signed   By: Kreg Shropshire M.D.   On: 08/21/2020 22:35    Procedures Procedures (including critical care time)  Medications Ordered in ED Medications  ketorolac (TORADOL) 30 MG/ML injection 30 mg (30 mg Intravenous Given 08/21/20 2145)  sodium chloride 0.9 % bolus 1,000 mL (1,000 mLs Intravenous Bolus 08/21/20 2144)  promethazine (PHENERGAN) injection 12.5 mg (12.5 mg Intravenous Given 08/21/20 2145)    ED Course  I have reviewed the triage vital signs and the nursing notes.  Pertinent labs & imaging results that were available during my care of the patient were reviewed by me and considered in my medical decision making (see chart for details).    MDM Rules/Calculators/A&P                          Emarie Paul is a 45 y.o. female here presenting with right-sided neck pain and left chest wall pain.  Patient had 1 negative troponin yesterday.  She has a history of fibromyalgia.  Patient also  has some right-sided neck pain but neurovascularly intact.  I think likely cervical radiculopathy versus musculoskeletal pain.  Plan to get one troponin and x-ray of the neck and basic labs.  Will give migraine cocktail and Toradol.   10:48 PM Labs including troponin negative. Xray showed C5-6 disc disease. I think likely cervical radiculopathy. Will dc home with steroids, muscle relaxants, spine follow up. Stable for discharge.     Final Clinical Impression(s) / ED Diagnoses Final diagnoses:  None    Rx / DC Orders ED Discharge Orders    None       Charlynne Pander, MD 08/21/20 2256

## 2020-12-23 ENCOUNTER — Other Ambulatory Visit: Payer: Self-pay

## 2020-12-23 ENCOUNTER — Ambulatory Visit
Admission: EM | Admit: 2020-12-23 | Discharge: 2020-12-23 | Disposition: A | Discharge status: Home / Self Care | Payer: BC Managed Care – PPO | Attending: Emergency Medicine | Admitting: Emergency Medicine

## 2020-12-23 DIAGNOSIS — K047 Periapical abscess without sinus: Secondary | ICD-10-CM

## 2020-12-23 DIAGNOSIS — J301 Allergic rhinitis due to pollen: Secondary | ICD-10-CM

## 2020-12-23 MED ORDER — IBUPROFEN 800 MG PO TABS: 800.0000 mg | ORAL_TABLET | Freq: Three times a day (TID) | ORAL | 0 refills | Status: DC

## 2020-12-23 MED ORDER — AMOXICILLIN-POT CLAVULANATE 875-125 MG PO TABS: 1.0000 | ORAL_TABLET | Freq: Two times a day (BID) | ORAL | 0 refills | Status: AC

## 2020-12-23 MED ORDER — FLUTICASONE PROPIONATE 50 MCG/ACT NA SUSP: 1.0000 | Freq: Every day | NASAL | 0 refills | Status: DC

## 2020-12-23 MED ORDER — CETIRIZINE HCL 10 MG PO CAPS: 10.0000 mg | ORAL_CAPSULE | Freq: Every day | ORAL | 0 refills | Status: AC

## 2020-12-23 NOTE — ED Triage Notes (Signed)
Pt c/o lt lower dental pain with facial swelling since yesterday. Pt c/o bilateral ear pressure, facial pressure, and sore throat since yesterday.

## 2020-12-23 NOTE — Discharge Instructions (Signed)
Begin Augmentin twice daily for 1 week to treat dental infection/abscess Warm compresses to area Tylenol and ibuprofen for pain and swelling Follow-up in the emergency room if developing any worsening symptoms over the next 48 hours  Begin daily cetirizine/Zyrtec for congestion, postnasal drainage Flonase nasal spray 1 to 2 spray in each nostril daily to help with sinus congestion and ear pressure Continue Tylenol and ibuprofen for sore throat, drink plenty of fluids Follow-up if not improving or worsening

## 2020-12-23 NOTE — ED Provider Notes (Signed)
[name]    CSN: [id number] Arrival date & time: 12/23/20  0851      History   Chief Complaint Chief Complaint  Patient presents with  . Dental Pain  . Facial Swelling    HPI Marissa Benton is a 45 y.o. female history of asthma presenting today for evaluation of dental pain and facial swelling.  Reports pain to left lower jaw.  Has had associated facial pressure sore throat and ear pressure.  Facial swelling began yesterday and is worsened overnight.  Has had ear pressure and postnasal drainage as well.  Denies known fevers.  HPI  Past Medical History:  Diagnosis Date  . Asthma   . Fibromyalgia   . Stroke [facility])     Patient Active Problem List   Diagnosis Date Noted  . Vitamin D deficiency 03/08/2019  . Asthma 01/13/2016  . Neck pain 01/13/2016  . Right cervical radiculopathy 01/13/2016  . 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 History of CVA (cerebrovascular accident) 01/13/2016  . Insomnia 01/13/2016    Past Surgical History:  Procedure Laterality Date  . DILATION AND CURETTAGE OF UTERUS      OB History   No obstetric history on file.      Home Medications    Prior to Admission medications   Medication Sig Start Date End Date Taking? Authorizing Provider  amoxicillin-clavulanate (AUGMENTIN) 875-125 MG tablet Take 1 tablet by mouth every 12 (twelve) hours for 7 days. 12/23/20 12/30/20 Yes Tiziana Cislo C, PA-C  Cetirizine HCl 10 MG CAPS Take 1 capsule (10 mg total) by mouth daily for 10 days. 12/23/20 01/02/21 Yes Arlone Lenhardt C, PA-C  fluticasone (FLONASE) 50 MCG/ACT nasal spray Place 1-2 sprays into both nostrils daily. 12/23/20  Yes Tijah Hane C, PA-C  ibuprofen (ADVIL) 800 MG tablet Take 1 tablet (800 mg total) by mouth 3 (three) times daily. 12/23/20  Yes Maragret Vanacker C, PA-C  albuterol (VENTOLIN HFA) 108 (90 Base) MCG/ACT inhaler Inhale 1-2 puffs into the lungs every 6 (six) hours as needed for wheezing or shortness of breath. 03/02/20    Hall-Potvin, [place], PA-C  amitriptyline (ELAVIL) 75 MG tablet Take 1-2 tablets (75-150 mg total) by mouth at bedtime. 03/06/19 07/05/19  Bing Neighbors, FNP  DULoxetine (CYMBALTA) 30 MG capsule Take 1 capsule (30 mg total) by mouth daily. 07/05/19 03/02/20  Cathie Hoops, Amy V, PA-C  pantoprazole (PROTONIX) 40 MG tablet Take 1 tablet (40 mg total) by mouth daily. 09/02/18 03/02/20  Bing Neighbors, FNP    Family History Family History  Problem Relation Age of Onset  . Healthy Mother   . Heart disease Father   . Kidney failure Father   . Neuropathy Father     Social History Social History   Tobacco Use  . Smoking status: Former Smoker    Types: Cigarettes    Quit date: 08/27/2018    Years since quitting: 2.3  . Smokeless tobacco: Never Used  Substance Use Topics  . Alcohol use: Yes    Comment: occasion  . Drug use: No     Allergies   Benadryl [diphenhydramine] and Iodinated diagnostic agents   Review of Systems Review of Systems  Constitutional: Negative for activity change, appetite change, chills, fatigue and fever.  HENT: Positive for congestion, dental problem, rhinorrhea and sore throat. Negative for ear pain, sinus pressure and trouble swallowing.   Eyes: Negative for discharge and redness.  Respiratory: Negative for cough, chest tightness and shortness of breath.   Cardiovascular: Negative  for chest pain.  Gastrointestinal: Negative for abdominal pain, diarrhea, nausea and vomiting.  Musculoskeletal: Negative for myalgias.  Skin: Negative for rash.  Neurological: Negative for dizziness, light-headedness and headaches.     Physical Exam Triage Vital Signs ED Triage Vitals  Enc Vitals Group     BP 12/23/20 0955 (!) 145/90     Pulse Rate 12/23/20 0955 80     Resp 12/23/20 0955 18     Temp 12/23/20 0955 98.3 F (36.8 C)     Temp Source 12/23/20 0955 Oral     SpO2 12/23/20 0955 97 %     Weight --      Height --      Head Circumference --      Peak Flow --       Pain Score 12/23/20 0953 8     Pain Loc --      Pain Edu? --      Excl. in GC? --    No data found.  Updated Vital Signs BP (!) 145/90 (BP Location: Left Arm)   Pulse 80   Temp 98.3 F (36.8 C) (Oral)   Resp 18   LMP 12/16/2020   SpO2 97%   Visual Acuity Right Eye Distance:   Left Eye Distance:   Bilateral Distance:    Right Eye Near:   Left Eye Near:    Bilateral Near:     Physical Exam Vitals and nursing note reviewed.  Constitutional:      Appearance: She is well-developed.     Comments: No acute distress  HENT:     Head: Normocephalic and atraumatic.     Comments: Swelling noted to left lower jaw with associated tenderness, no overlying erythema or warmth    Ears:     Comments: Bilateral ears without tenderness to palpation of external auricle, tragus and mastoid, EAC's without erythema or swelling, TM's with good bony landmarks and cone of light. Non erythematous.     Nose: Nose normal.     Mouth/Throat:     Comments: Oral mucosa pink and moist, no tonsillar enlargement or exudate. Posterior pharynx patent and nonerythematous, no uvula deviation or swelling. Normal phonation.  Fractured tooth with root present to second to last posterior molar on left lower jaw, surrounding gingival swelling and erythema Eyes:     Conjunctiva/sclera: Conjunctivae normal.  Cardiovascular:     Rate and Rhythm: Normal rate.  Pulmonary:     Effort: Pulmonary effort is normal. No respiratory distress.     Comments: Breathing comfortably at rest, CTABL, no wheezing, rales or other adventitious sounds auscultated Abdominal:     General: There is no distension.  Musculoskeletal:        General: Normal range of motion.     Cervical back: Neck supple.  Skin:    General: Skin is warm and dry.  Neurological:     Mental Status: She is alert and oriented to person, place, and time.      UC Treatments / Results  Labs (all labs ordered are listed, but only abnormal results are  displayed) Labs Reviewed - No data to display  EKG   Radiology No results found.  Procedures Procedures (including critical care time)  Medications Ordered in UC Medications - No data to display  Initial Impression / Assessment and Plan / UC Course  I have reviewed the triage vital signs and the nursing notes.  Pertinent labs & imaging results that were available during my care of the patient  were reviewed by me and considered in my medical decision making (see chart for details).     Dental abscess-initiating on Augmentin, anti-inflammatories and warm compresses, no sign of Ludwick's angina/deep space infection at this time.  Continue to monitor.  URI symptoms/allergic rhinitis-initiating on daily cetirizine and Flonase.  Tylenol and ibuprofen as needed for sore throat.  Discussed strict return precautions. Patient verbalized understanding and is agreeable with plan.  Final Clinical Impressions(s) / UC Diagnoses   Final diagnoses:  Dental abscess  Allergic rhinitis due to pollen, unspecified seasonality     Discharge Instructions     Begin Augmentin twice daily for 1 week to treat dental infection/abscess Warm compresses to area Tylenol and ibuprofen for pain and swelling Follow-up in the emergency room if developing any worsening symptoms over the next 48 hours  Begin daily cetirizine/Zyrtec for congestion, postnasal drainage Flonase nasal spray 1 to 2 spray in each nostril daily to help with sinus congestion and ear pressure Continue Tylenol and ibuprofen for sore throat, drink plenty of fluids Follow-up if not improving or worsening    ED Prescriptions    Medication Sig Dispense Auth. Provider   amoxicillin-clavulanate (AUGMENTIN) 875-125 MG tablet Take 1 tablet by mouth every 12 (twelve) hours for 7 days. 14 tablet Lawyer Washabaugh C, PA-C   Cetirizine HCl 10 MG CAPS Take 1 capsule (10 mg total) by mouth daily for 10 days. 10 capsule Briane Birden C, PA-C    fluticasone (FLONASE) 50 MCG/ACT nasal spray Place 1-2 sprays into both nostrils daily. 16 g Colbe Viviano C, PA-C   ibuprofen (ADVIL) 800 MG tablet Take 1 tablet (800 mg total) by mouth 3 (three) times daily. 21 tablet Florabel Faulks, [place] C, PA-C     PDMP not reviewed this encounter.   Lew Dawes, [place] 12/23/20 1040

## 2021-04-28 ENCOUNTER — Other Ambulatory Visit: Payer: Self-pay

## 2021-04-28 ENCOUNTER — Encounter (HOSPITAL_COMMUNITY): Payer: Self-pay

## 2021-04-28 ENCOUNTER — Emergency Department (HOSPITAL_COMMUNITY)
Admission: EM | Admit: 2021-04-28 | Discharge: 2021-04-28 | Disposition: A | Discharge status: Home / Self Care | Payer: BC Managed Care – PPO | Attending: Emergency Medicine | Admitting: Emergency Medicine

## 2021-04-28 DIAGNOSIS — J45909 Unspecified asthma, uncomplicated: Secondary | ICD-10-CM | POA: Insufficient documentation

## 2021-04-28 DIAGNOSIS — Z87891 Personal history of nicotine dependence: Secondary | ICD-10-CM | POA: Insufficient documentation

## 2021-04-28 DIAGNOSIS — M5412 Radiculopathy, cervical region: Secondary | ICD-10-CM | POA: Diagnosis not present

## 2021-04-28 DIAGNOSIS — M542 Cervicalgia: Secondary | ICD-10-CM | POA: Diagnosis present

## 2021-04-28 MED ORDER — PREDNISONE 20 MG PO TABS
20.0000 mg | ORAL_TABLET | Freq: Once | ORAL | Status: AC
  Administered 2021-04-28: 20 mg via ORAL
  Filled 2021-04-28: qty 1

## 2021-04-28 MED ORDER — PREDNISONE 10 MG PO TABS: 20.0000 mg | ORAL_TABLET | Freq: Every day | ORAL | 0 refills | Status: DC

## 2021-04-28 MED ORDER — TIZANIDINE HCL 4 MG PO TABS: 4.0000 mg | ORAL_TABLET | Freq: Four times a day (QID) | ORAL | 0 refills | Status: AC | PRN

## 2021-04-28 MED ORDER — KETOROLAC TROMETHAMINE 15 MG/ML IJ SOLN
30.0000 mg | Freq: Once | INTRAMUSCULAR | Status: AC
  Administered 2021-04-28: 30 mg via INTRAMUSCULAR
  Filled 2021-04-28: qty 2

## 2021-04-28 NOTE — ED Provider Notes (Signed)
Emergency Medicine Provider Triage Evaluation Note  Marissa Benton , a 45 y.o. female  was evaluated in triage.  Pt complains of radicular neck pain.  Review of Systems  Positive: Neck pain, R arm pain Negative: Fever, headache  Physical Exam  There were no vitals taken for this visit. Gen:   Awake, no distress   Resp:  Normal effort  MSK:   Moves extremities without difficulty  Other:    Medical Decision Making  Medically screening exam initiated at 9:42 AM.  Appropriate orders placed.  Marissa Benton was informed that the remainder of the evaluation will be completed by another provider, this initial triage assessment does not replace that evaluation, and the importance of remaining in the ED until their evaluation is complete.  Pt with radicular neck pain radiates to R arm since this morning.  Has had similar sxs in the past but not this severe.  No trauma, no fever.    Fayrene Helper, PA-C 04/28/21 3612    Gloris Manchester, MD 04/30/21 [phone]

## 2021-04-28 NOTE — Discharge Instructions (Addendum)
Return for any problem.  ?

## 2021-04-28 NOTE — ED Triage Notes (Signed)
Patient complains of posterior neck pain with radiation down right arm x 2 days. Pain worse with ROM. Denies trauma

## 2021-04-28 NOTE — ED Provider Notes (Signed)
[facility] EMERGENCY DEPARTMENT Provider Note   CSN: [id number] Arrival date & time: 04/28/21  0930     History No chief complaint on file.   Marissa Benton is a 45 y.o. female.  45 year old female with prior medical history as detailed below presents for evaluation.  Patient complains of pain in the right neck radiating to the right arm.  Patient with prior history of cervical radiculopathy with similar presentation in the past.  Patient reports that she took some Tylenol with minimal improvement in her symptoms.  She denies other complaint.  She denies fever, nausea, vomiting, chest pain, abdominal pain, or other complaint.  The history is provided by the patient.  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     Family History  Problem Relation Age of Onset   Healthy Mother    Heart disease Father    Kidney failure Father    Neuropathy Father     Social History   Tobacco Use   Smoking status: Former    Types: Cigarettes    Quit date: 08/27/2018    Years since quitting: 2.6   Smokeless tobacco: Never  Substance Use Topics   Alcohol use: Yes    Comment: occasion   Drug use: No    Home Medications Prior to Admission medications   Medication Sig Start Date End Date Taking?  Authorizing Provider  albuterol (VENTOLIN HFA) 108 (90 Base) MCG/ACT inhaler Inhale 1-2 puffs into the lungs every 6 (six) hours as needed for wheezing or shortness of breath. 03/02/20   Hall-Potvin, [place], PA-C  Cetirizine HCl 10 MG CAPS Take 1 capsule (10 mg total) by mouth daily for 10 days. 12/23/20 01/02/21  Wieters, Hallie C, PA-C  fluticasone (FLONASE) 50 MCG/ACT nasal spray Place 1-2 sprays into both nostrils daily. 12/23/20   Wieters, Hallie C, PA-C  ibuprofen (ADVIL) 800 MG tablet Take 1 tablet (800 mg total) by mouth 3 (three) times daily. 12/23/20   Wieters, Hallie C, PA-C  amitriptyline (ELAVIL) 75 MG tablet Take 1-2 tablets (75-150 mg total) by mouth at bedtime. 03/06/19 07/05/19  Bing Neighbors, FNP  DULoxetine (CYMBALTA) 30 MG capsule Take 1 capsule (30 mg total) by mouth daily. 07/05/19 03/02/20  Cathie Hoops, Amy V, PA-C  pantoprazole (PROTONIX) 40 MG tablet Take 1 tablet (40 mg total) by mouth daily. 09/02/18 03/02/20  Bing Neighbors, FNP    Allergies    Benadryl [diphenhydramine] and Iodinated diagnostic agents  Review of Systems   Review of Systems  All other systems reviewed and are negative.  Physical Exam Updated Vital Signs BP (!) 147/97 (BP Location: Left Arm)   Pulse 69   Temp 98 F (36.7 C) (Oral)   Resp 16  SpO2 100%   Physical Exam Vitals and nursing note reviewed.  Constitutional:      General: She is not in acute distress.    Appearance: Normal appearance. She is well-developed.  HENT:     Head: Normocephalic and atraumatic.  Eyes:     Conjunctiva/sclera: Conjunctivae normal.     Pupils: Pupils are equal, round, and reactive to light.  Cardiovascular:     Rate and Rhythm: Normal rate and regular rhythm.     Heart sounds: Normal heart sounds.  Pulmonary:     Effort: Pulmonary effort is normal. No respiratory distress.     Breath sounds: Normal breath sounds.  Abdominal:     General: There is no distension.     Palpations: Abdomen is soft.     Tenderness:  There is no abdominal tenderness.  Musculoskeletal:        General: No deformity. Normal range of motion.     Cervical back: Normal range of motion and neck supple.     Comments: 5/5 strength in both upper extremities.  Both upper extremities are neurovascularly intact distally.  Skin:    General: Skin is warm and dry.  Neurological:     General: No focal deficit present.     Mental Status: She is alert and oriented to person, place, and time.    ED Results / Procedures / Treatments   Labs (all labs ordered are listed, but only abnormal results are displayed) Labs Reviewed - No data to display  EKG None  Radiology No results found.  Procedures Procedures   Medications Ordered in ED Medications  ketorolac (TORADOL) 15 MG/ML injection 30 mg (has no administration in time range)  predniSONE (DELTASONE) tablet 20 mg (has no administration in time range)    ED Course  I have reviewed the triage vital signs and the nursing notes.  Pertinent labs & imaging results that were available during my care of the patient were reviewed by me and considered in my medical decision making (see chart for details).    MDM Rules/Calculators/A&P                           MDM  MSE complete  Marissa Benton was evaluated in Emergency Department on 04/28/2021 for the symptoms described in the history of present illness. She was evaluated in the context of the global COVID-19 pandemic, which necessitated consideration that the patient might be at risk for infection with the SARS-CoV-2 virus that causes COVID-19. Institutional protocols and algorithms that pertain to the evaluation of patients at risk for COVID-19 are in a state of rapid change based on information released by regulatory bodies including the CDC and federal and state organizations. These policies and algorithms were followed during the patient's care in the ED.   Patient is presenting with complaint of pain in the right shoulder  consistent with likely mild cervical radiculopathy.  Patient is neurovascular intact in her upper extremities.  Patient would likely benefit from short course of prednisone and antispasmodic.  Patient does understand need for close follow-up.  Strict return precautions given and understood.    Final Clinical Impression(s) / ED Diagnoses Final diagnoses:  Cervical radiculopathy    Rx / DC Orders ED Discharge Orders          Ordered    predniSONE (DELTASONE) 10 MG tablet  Daily        04/28/21 1443    tiZANidine (ZANAFLEX) 4 MG  tablet  Every 6 hours PRN        04/28/21 1443             Wynetta Fines, MD 04/28/21 1445

## 2021-06-16 ENCOUNTER — Encounter: Payer: Self-pay | Admitting: Emergency Medicine

## 2021-06-16 ENCOUNTER — Emergency Department (HOSPITAL_COMMUNITY)
Admission: EM | Admit: 2021-06-16 | Discharge: 2021-06-17 | Disposition: A | Discharge status: Home / Self Care | Payer: BC Managed Care – PPO | Attending: Emergency Medicine | Admitting: Emergency Medicine

## 2021-06-16 ENCOUNTER — Ambulatory Visit
Admission: EM | Admit: 2021-06-16 | Discharge: 2021-06-16 | Disposition: A | Discharge status: Home / Self Care | Payer: BC Managed Care – PPO

## 2021-06-16 ENCOUNTER — Other Ambulatory Visit: Payer: Self-pay

## 2021-06-16 DIAGNOSIS — K1379 Other lesions of oral mucosa: Secondary | ICD-10-CM

## 2021-06-16 DIAGNOSIS — R1310 Dysphagia, unspecified: Secondary | ICD-10-CM | POA: Diagnosis not present

## 2021-06-16 DIAGNOSIS — K029 Dental caries, unspecified: Secondary | ICD-10-CM | POA: Diagnosis not present

## 2021-06-16 DIAGNOSIS — K0889 Other specified disorders of teeth and supporting structures: Secondary | ICD-10-CM | POA: Insufficient documentation

## 2021-06-16 DIAGNOSIS — Z87891 Personal history of nicotine dependence: Secondary | ICD-10-CM | POA: Diagnosis not present

## 2021-06-16 DIAGNOSIS — J45909 Unspecified asthma, uncomplicated: Secondary | ICD-10-CM | POA: Diagnosis not present

## 2021-06-16 DIAGNOSIS — L0211 Cutaneous abscess of neck: Secondary | ICD-10-CM | POA: Diagnosis not present

## 2021-06-16 DIAGNOSIS — K056 Periodontal disease, unspecified: Secondary | ICD-10-CM | POA: Diagnosis not present

## 2021-06-16 DIAGNOSIS — J320 Chronic maxillary sinusitis: Secondary | ICD-10-CM | POA: Diagnosis not present

## 2021-06-16 LAB — I-STAT CHEM 8, ED
BUN: 9 mg/dL (ref 6–20)
Calcium, Ion: 1.19 mmol/L (ref 1.15–1.40)
Chloride: 105 mmol/L (ref 98–111)
Creatinine, Ser: 0.7 mg/dL (ref 0.44–1.00)
Glucose, Bld: 114 mg/dL — ABNORMAL HIGH (ref 70–99)
HCT: 38 % (ref 36.0–46.0)
Hemoglobin: 12.9 g/dL (ref 12.0–15.0)
Potassium: 3.8 mmol/L (ref 3.5–5.1)
Sodium: 142 mmol/L (ref 135–145)
TCO2: 28 mmol/L (ref 22–32)

## 2021-06-16 LAB — I-STAT BETA HCG BLOOD, ED (MC, WL, AP ONLY): I-stat hCG, quantitative: <5 m[IU]/mL (ref <5)

## 2021-06-16 MED ORDER — HYDROCORTISONE SOD SUC (PF) 250 MG IJ SOLR
200.0000 mg | Freq: Once | INTRAMUSCULAR | Status: DC
Start: 2021-06-16 — End: 2021-06-17
  Filled 2021-06-16: qty 200

## 2021-06-16 MED ORDER — DIPHENHYDRAMINE HCL 50 MG/ML IJ SOLN: 50.0000 mg | Freq: Once | INTRAMUSCULAR | Status: DC

## 2021-06-16 MED ORDER — DIPHENHYDRAMINE HCL 25 MG PO CAPS: 50.0000 mg | ORAL_CAPSULE | Freq: Once | ORAL | Status: DC

## 2021-06-16 NOTE — ED Provider Notes (Signed)
[name]    CSN: [id number] Arrival date & time: 06/16/21  1326      History   Chief Complaint No chief complaint on file.   HPI Marissa Benton is a 45 y.o. female.   Patient presents with left-sided mouth pain that radiates up left jaw into the left ear that has been present for a few days.  Patient reports poor dentition and history of dental abscess that she is concerned for. Denies any fevers or purulent drainage in the mouth.  Denies any upper respiratory symptoms.  Patient having difficulty opening mouth due to pain.  Patient reports that she had "abscess in jaw" a few years prior that had to be surgically removed with an emergent procedure.  Patient reports that this "feels similar". Patient denies injury or trauma to the mouth.     Past Medical History:  Diagnosis Date   Asthma    Fibromyalgia    Stroke [facility])     Patient Active Problem List   Diagnosis Date Noted   Vitamin D deficiency 03/08/2019   Asthma 01/13/2016   Neck pain 01/13/2016   Right cervical radiculopathy 01/13/2016   Bilateral sciatica 01/13/2016   History of CVA (cerebrovascular accident) 01/13/2016   Insomnia 01/13/2016    Past Surgical History:  Procedure Laterality Date   DILATION AND CURETTAGE OF UTERUS      OB History   No obstetric history on file.      Home Medications    Prior to Admission medications   Medication Sig Start Date End Date Taking? Authorizing Provider  albuterol (VENTOLIN HFA) 108 (90 Base) MCG/ACT inhaler Inhale 1-2 puffs into the lungs every 6 (six) hours as needed for wheezing or shortness of breath. 03/02/20   Hall-Potvin, [place], PA-C  Cetirizine HCl 10 MG CAPS Take 1 capsule (10 mg total) by mouth daily for 10 days. 12/23/20 01/02/21  Wieters, Hallie C, PA-C  fluticasone (FLONASE) 50 MCG/ACT nasal spray Place 1-2 sprays into both nostrils daily. 12/23/20   Wieters, Hallie C, PA-C  ibuprofen (ADVIL) 800 MG tablet Take 1 tablet (800 mg total) by  mouth 3 (three) times daily. 12/23/20   Wieters, Hallie C, PA-C  predniSONE (DELTASONE) 10 MG tablet Take 2 tablets (20 mg total) by mouth daily. 04/28/21   Wynetta Fines, MD  tiZANidine (ZANAFLEX) 4 MG tablet Take 1 tablet (4 mg total) by mouth every 6 (six) hours as needed for muscle spasms. 04/28/21   Wynetta Fines, MD  amitriptyline (ELAVIL) 75 MG tablet Take 1-2 tablets (75-150 mg total) by mouth at bedtime. 03/06/19 07/05/19  Bing Neighbors, FNP  DULoxetine (CYMBALTA) 30 MG capsule Take 1 capsule (30 mg total) by mouth daily. 07/05/19 03/02/20  Cathie Hoops, Amy V, PA-C  pantoprazole (PROTONIX) 40 MG tablet Take 1 tablet (40 mg total) by mouth daily. 09/02/18 03/02/20  Bing Neighbors, FNP    Family History Family History  Problem Relation Age of Onset   Healthy Mother    Heart disease Father    Kidney failure Father    Neuropathy Father     Social History Social History   Tobacco Use   Smoking status: Former    Types: Cigarettes    Quit date: 08/27/2018    Years since quitting: 2.8   Smokeless tobacco: Never  Substance Use Topics   Alcohol use: Yes    Comment: occasion   Drug use: No     Allergies   Benadryl [diphenhydramine] and Iodinated diagnostic  agents   Review of Systems Review of Systems Per HPI  Physical Exam Triage Vital Signs ED Triage Vitals  Enc Vitals Group     BP 06/16/21 1432 (!) 143/97     Pulse Rate 06/16/21 1432 92     Resp 06/16/21 1432 16     Temp 06/16/21 1432 98.1 F (36.7 C)     Temp Source 06/16/21 1432 Oral     SpO2 06/16/21 1432 95 %     Weight --      Height --      Head Circumference --      Peak Flow --      Pain Score 06/16/21 1436 10     Pain Loc --      Pain Edu? --      Excl. in GC? --    No data found.  Updated Vital Signs BP (!) 143/97 (BP Location: Left Arm)   Pulse 92   Temp 98.1 F (36.7 C) (Oral)   Resp 16   SpO2 95%   Visual Acuity Right Eye Distance:   Left Eye Distance:   Bilateral Distance:    Right  Eye Near:   Left Eye Near:    Bilateral Near:     Physical Exam Constitutional:      General: She is not in acute distress.    Appearance: Normal appearance. She is not toxic-appearing or diaphoretic.  HENT:     Head: Normocephalic and atraumatic.     Right Ear: Tympanic membrane and ear canal normal.     Left Ear: Tympanic membrane and ear canal normal.     Nose: Nose normal.     Mouth/Throat:     Lips: Pink.     Mouth: Mucous membranes are moist. No oral lesions.     Dentition: Abnormal dentition. Does not have dentures. No dental tenderness, gingival swelling, dental abscesses or gum lesions.     Tongue: No lesions.     Pharynx: Oropharynx is clear. Uvula midline. No oropharyngeal exudate or posterior oropharyngeal erythema.     Tonsils: No tonsillar exudate or tonsillar abscesses.     Comments: Patient has poor dentition but no signs of dental infection or abscess.  Patient is not able to adequately open mouth due to pain that is occurring. Eyes:     Extraocular Movements: Extraocular movements intact.     Conjunctiva/sclera: Conjunctivae normal.  Pulmonary:     Effort: Pulmonary effort is normal.  Neurological:     General: No focal deficit present.     Mental Status: She is alert and oriented to person, place, and time. Mental status is at baseline.  Psychiatric:        Mood and Affect: Mood normal.        Behavior: Behavior normal.        Thought Content: Thought content normal.        Judgment: Judgment normal.     UC Treatments / Results  Labs (all labs ordered are listed, but only abnormal results are displayed) Labs Reviewed - No data to display  EKG   Radiology No results found.  Procedures Procedures (including critical care time)  Medications Ordered in UC Medications - No data to display  Initial Impression / Assessment and Plan / UC Course  I have reviewed the triage vital signs and the nursing notes.  Pertinent labs & imaging results that were  available during my care of the patient were reviewed by me and considered in  my medical decision making (see chart for details).     No signs of obvious infection on exam.  Patient will need to go to the hospital for further evaluation and management due to history of infections in the mouth that required emergent surgery.  Patient was agreeable plan.  Vital signs stable at discharge.  Agree with patient self transport to the hospital. Final Clinical Impressions(s) / UC Diagnoses   Final diagnoses:  Mouth pain     Discharge Instructions      Please go to the emergency department as soon as you leave urgent care for further evaluation and management.     ED Prescriptions   None    PDMP not reviewed this encounter.   Lance Muss, FNP 06/16/21 1504

## 2021-06-16 NOTE — Discharge Instructions (Addendum)
Please go to the emergency department as soon as you leave urgent care for further evaluation and management. ?

## 2021-06-16 NOTE — ED Triage Notes (Signed)
Pt in from UC d/t generalized oral pain. Pt states that she's having difficulty opening her mouth due to the pain that's all over her mouth. Pt states that she's doesn't have an abscess or a bad tooth. Pt stated that she's having nerve pain, jaw pain and pain that radiates all over her face. Pt states no fevers, or nausea at this time.

## 2021-06-16 NOTE — ED Provider Notes (Signed)
Emergency Medicine Provider Triage Evaluation Note  Marissa Benton , a 45 y.o. female  was evaluated in triage.  Pt complains of left lower dental pain and inability to open mouth. Seen at urgent care and sent here for ct.  Review of Systems  Positive: Dental pain, inability to open mouth Negative: fever  Physical Exam  BP (!) 156/106 (BP Location: Right Arm)   Pulse 87   Temp 99.2 F (37.3 C) (Oral)   Resp 16   Ht 5\' 2"  (1.575 m)   Wt 89.8 kg   SpO2 99%   BMI 36.21 kg/m  Gen:   Awake, no distress   Resp:  Normal effort  MSK:   Moves extremities without difficulty  Other:  Trismus, ttp to the left mandible, visualized fracture to the left lower molar but unable to palpate for abscess as pt has trismus and cannot tolerate opening her mouth  Medical Decision Making  Medically screening exam initiated at 4:28 PM.  Appropriate orders placed.  Marissa Benton was informed that the remainder of the evaluation will be completed by another provider, this initial triage assessment does not replace that evaluation, and the importance of remaining in the ED until their evaluation is complete.     Julienne Kass, PA-C 06/16/21 1631    06/18/21, MD 06/16/21 2051

## 2021-06-16 NOTE — ED Triage Notes (Signed)
Hx of poor dentition, states she's trying to get in with the dentist. States today began having oral pain to the point of having difficulty opening mouth, radiating up into face and left ear.

## 2021-06-17 ENCOUNTER — Emergency Department (HOSPITAL_COMMUNITY): Payer: BC Managed Care – PPO

## 2021-06-17 DIAGNOSIS — L0211 Cutaneous abscess of neck: Secondary | ICD-10-CM | POA: Diagnosis not present

## 2021-06-17 DIAGNOSIS — K0889 Other specified disorders of teeth and supporting structures: Secondary | ICD-10-CM | POA: Diagnosis not present

## 2021-06-17 DIAGNOSIS — K056 Periodontal disease, unspecified: Secondary | ICD-10-CM | POA: Diagnosis not present

## 2021-06-17 DIAGNOSIS — J320 Chronic maxillary sinusitis: Secondary | ICD-10-CM | POA: Diagnosis not present

## 2021-06-17 DIAGNOSIS — K029 Dental caries, unspecified: Secondary | ICD-10-CM | POA: Diagnosis not present

## 2021-06-17 LAB — CBC WITH DIFFERENTIAL/PLATELET
Abs Immature Granulocytes: 0.02 10*3/uL (ref 0.00–0.07)
Basophils Absolute: 0 10*3/uL (ref 0.0–0.1)
Basophils Relative: 0 %
Eosinophils Absolute: 0.1 10*3/uL (ref 0.0–0.5)
Eosinophils Relative: 1 %
HCT: 36.4 % (ref 36.0–46.0)
Hemoglobin: 11.2 g/dL — ABNORMAL LOW (ref 12.0–15.0)
Immature Granulocytes: 0 %
Lymphocytes Relative: 25 %
Lymphs Abs: 1.6 10*3/uL (ref 0.7–4.0)
MCH: 32.6 pg (ref 26.0–34.0)
MCHC: 30.8 g/dL (ref 30.0–36.0)
MCV: 105.8 fL — ABNORMAL HIGH (ref 80.0–100.0)
Monocytes Absolute: 0.7 10*3/uL (ref 0.1–1.0)
Monocytes Relative: 10 %
Neutro Abs: 4 10*3/uL (ref 1.7–7.7)
Neutrophils Relative %: 64 %
Platelets: 247 10*3/uL (ref 150–400)
RBC: 3.44 MIL/uL — ABNORMAL LOW (ref 3.87–5.11)
RDW: 12.7 % (ref 11.5–15.5)
WBC: 6.3 10*3/uL (ref 4.0–10.5)
nRBC: 0 % (ref 0.0–0.2)

## 2021-06-17 LAB — BASIC METABOLIC PANEL
Anion gap: 9 (ref 5–15)
BUN: 10 mg/dL (ref 6–20)
CO2: 22 mmol/L (ref 22–32)
Calcium: 8.5 mg/dL — ABNORMAL LOW (ref 8.9–10.3)
Chloride: 106 mmol/L (ref 98–111)
Creatinine, Ser: 0.84 mg/dL (ref 0.44–1.00)
GFR, Estimated: >60 mL/min (ref >60)
Glucose, Bld: 93 mg/dL (ref 70–99)
Potassium: 5.1 mmol/L (ref 3.5–5.1)
Sodium: 137 mmol/L (ref 135–145)

## 2021-06-17 MED ORDER — CLINDAMYCIN HCL 150 MG PO CAPS: 150.0000 mg | ORAL_CAPSULE | Freq: Three times a day (TID) | ORAL | 0 refills | Status: DC

## 2021-06-17 MED ORDER — KETOROLAC TROMETHAMINE 30 MG/ML IJ SOLN
15.0000 mg | Freq: Once | INTRAMUSCULAR | Status: AC
  Administered 2021-06-17: 15 mg via INTRAVENOUS
  Filled 2021-06-17: qty 1

## 2021-06-17 MED ORDER — CLINDAMYCIN HCL 150 MG PO CAPS
150.0000 mg | ORAL_CAPSULE | Freq: Once | ORAL | Status: AC
  Administered 2021-06-17: 150 mg via ORAL
  Filled 2021-06-17: qty 1

## 2021-06-17 MED ORDER — IOHEXOL 350 MG/ML SOLN
75.0000 mL | Freq: Once | INTRAVENOUS | Status: AC | PRN
  Administered 2021-06-17: 75 mL via INTRAVENOUS

## 2021-06-17 MED ORDER — SODIUM CHLORIDE 0.9 % IV BOLUS
1000.0000 mL | Freq: Once | INTRAVENOUS | Status: AC
  Administered 2021-06-17: 1000 mL via INTRAVENOUS

## 2021-06-17 NOTE — ED Provider Notes (Signed)
[facility] EMERGENCY DEPARTMENT Provider Note   CSN: [id number] Arrival date & time: 06/16/21  1555     History Chief Complaint  Patient presents with   Dental Pain    Marissa Benton is a 45 y.o. female.  45 year old female with prior medical history as detailed below presents for evaluation.  Patient complains of approximately 1 week of oral pain.  Patient complains of pain to the upper jaw mostly on the left.  She reports this pain has gradually worsened over the last several days.  She reports that she is already taking a steroid taper and 7 days of amoxicillin.  She reports no improvement with use of antibiotics and steroids.  She was seen at urgent care just prior to being referred to the ED for further evaluation.  She reports pain with swallowing.  She denies fever.  She reports prior history of intraoral abscess.  It is unclear exactly what type of abscess this was.  Of note, patient did adamantly refused reported allergy to IV or iodine contrast.  She reports that she has had contrasted studies in the past without any side effect or reaction.  The history is provided by the patient.  Dental Pain Location:  Upper Upper teeth location:  10/LU lateral incisor and 11/LU cuspid Quality:  Aching Severity:  Moderate Onset quality:  Gradual Duration:  2 weeks Timing:  Constant Progression:  Worsening Chronicity:  New     Past Medical History:  Diagnosis Date   Asthma    Fibromyalgia    Stroke [facility])     Patient Active Problem List   Diagnosis Date Noted   Vitamin D deficiency 03/08/2019   Asthma 01/13/2016   Neck pain 01/13/2016   Right cervical radiculopathy 01/13/2016   Bilateral sciatica 01/13/2016   History of CVA (cerebrovascular accident) 01/13/2016   Insomnia 01/13/2016    Past Surgical History:  Procedure Laterality Date   DILATION AND CURETTAGE OF UTERUS       OB History   No obstetric history on file.     Family History   Problem Relation Age of Onset   Healthy Mother    Heart disease Father    Kidney failure Father    Neuropathy Father     Social History   Tobacco Use   Smoking status: Former    Types: Cigarettes    Quit date: 08/27/2018    Years since quitting: 2.8   Smokeless tobacco: Never  Substance Use Topics   Alcohol use: Yes    Comment: occasion   Drug use: No    Home Medications Prior to Admission medications   Medication Sig Start Date End Date Taking? Authorizing Provider  albuterol (VENTOLIN HFA) 108 (90 Base) MCG/ACT inhaler Inhale 1-2 puffs into the lungs every 6 (six) hours as needed for wheezing or shortness of breath. 03/02/20   Hall-Potvin, [place], PA-C  Cetirizine HCl 10 MG CAPS Take 1 capsule (10 mg total) by mouth daily for 10 days. 12/23/20 01/02/21  Wieters, Hallie C, PA-C  fluticasone (FLONASE) 50 MCG/ACT nasal spray Place 1-2 sprays into both nostrils daily. 12/23/20   Wieters, Hallie C, PA-C  ibuprofen (ADVIL) 800 MG tablet Take 1 tablet (800 mg total) by mouth 3 (three) times daily. 12/23/20   Wieters, Hallie C, PA-C  predniSONE (DELTASONE) 10 MG tablet Take 2 tablets (20 mg total) by mouth daily. 04/28/21   Wynetta Fines, MD  tiZANidine (ZANAFLEX) 4 MG tablet Take 1 tablet (4 mg total)  by mouth every 6 (six) hours as needed for muscle spasms. 04/28/21   Wynetta Fines, MD  amitriptyline (ELAVIL) 75 MG tablet Take 1-2 tablets (75-150 mg total) by mouth at bedtime. 03/06/19 07/05/19  Bing Neighbors, FNP  DULoxetine (CYMBALTA) 30 MG capsule Take 1 capsule (30 mg total) by mouth daily. 07/05/19 03/02/20  Cathie Hoops, Amy V, PA-C  pantoprazole (PROTONIX) 40 MG tablet Take 1 tablet (40 mg total) by mouth daily. 09/02/18 03/02/20  Bing Neighbors, FNP    Allergies    Benadryl [diphenhydramine]  Review of Systems   Review of Systems  All other systems reviewed and are negative.  Physical Exam Updated Vital Signs BP (!) 139/110 (BP Location: Right Arm)   Pulse 83   Temp 98.9 F  (37.2 C)   Resp 17   Ht 5\' 2"  (1.575 m)   Wt 89.8 kg   SpO2 99%   BMI 36.21 kg/m   Physical Exam Vitals and nursing note reviewed.  Constitutional:      General: She is not in acute distress.    Appearance: Normal appearance. She is well-developed.     Comments: Patient appears to be in no acute distress.  She is speaking in full sentences.  She is handling her own secretions.  HENT:     Head: Normocephalic and atraumatic.     Right Ear: Tympanic membrane normal.     Left Ear: Tympanic membrane normal.     Nose: Nose normal. No congestion or rhinorrhea.     Mouth/Throat:     Comments: Pain localized by the patient mostly to the left upper hard palate.  No evidence of excess secretions or purulent drainage on intraoral exam  No clearly visualized dental or peritonsillar abscess  Normal phonation  Patient is handling her own secretions without difficulty Eyes:     Conjunctiva/sclera: Conjunctivae normal.     Pupils: Pupils are equal, round, and reactive to light.  Cardiovascular:     Rate and Rhythm: Normal rate and regular rhythm.     Heart sounds: Normal heart sounds.  Pulmonary:     Effort: Pulmonary effort is normal. No respiratory distress.     Breath sounds: Normal breath sounds.  Abdominal:     General: There is no distension.     Palpations: Abdomen is soft.     Tenderness: There is no abdominal tenderness.  Musculoskeletal:        General: No deformity. Normal range of motion.     Cervical back: Normal range of motion and neck supple.  Skin:    General: Skin is warm and dry.  Neurological:     General: No focal deficit present.     Mental Status: She is alert and oriented to person, place, and time. Mental status is at baseline.    ED Results / Procedures / Treatments   Labs (all labs ordered are listed, but only abnormal results are displayed) Labs Reviewed  I-STAT CHEM 8, ED - Abnormal; Notable for the following components:      Result Value    Glucose, Bld 114 (*)    All other components within normal limits  CBC WITH DIFFERENTIAL/PLATELET  BASIC METABOLIC PANEL  I-STAT BETA HCG BLOOD, ED ([facility], [facility], [facility] ONLY)    EKG None  Radiology No results found.  Procedures Procedures   Medications Ordered in ED Medications  sodium chloride 0.9 % bolus 1,000 mL (has no administration in time range)  ketorolac (TORADOL) 30 MG/ML injection 15  mg (has no administration in time range)    ED Course  I have reviewed the triage vital signs and the nursing notes.  Pertinent labs & imaging results that were available during my care of the patient were reviewed by me and considered in my medical decision making (see chart for details).    MDM Rules/Calculators/A&P                           MDM  MSE complete  Chrysten Woulfe was evaluated in Emergency Department on 06/17/2021 for the symptoms described in the history of present illness. She was evaluated in the context of the global COVID-19 pandemic, which necessitated consideration that the patient might be at risk for infection with the SARS-CoV-2 virus that causes COVID-19. Institutional protocols and algorithms that pertain to the evaluation of patients at risk for COVID-19 are in a state of rapid change based on information released by regulatory bodies including the CDC and federal and state organizations. These policies and algorithms were followed during the patient's care in the ED.  Patient is presenting with complaint of left-sided dental pain.  Patient is without evidence of airway compromise or difficulty handling her own secretions.  Work-up in the ED is without evidence of discrete abscess or other findings that would necessitate drainage or procedure today.  Patient is reporting that she has been on amoxicillin and prednisone for the last 7 days.  Patient is advised to complete her previously prescribed course of prednisone.  She is advised to stop taking the amoxicillin  and to initiate a course of clindamycin.  She reports that this provider that she has an appointment on this coming Monday with her dentist.  She is strongly advised to keep that appointment.  Importance of close follow-up is repeatedly stressed.  Strict return cautions given and understood.       Final Clinical Impression(s) / ED Diagnoses Final diagnoses:  Pain, dental    Rx / DC Orders ED Discharge Orders          Ordered    clindamycin (CLEOCIN) 150 MG capsule  3 times daily        06/17/21 0956             Wynetta Fines, MD 06/17/21 [phone]

## 2021-06-17 NOTE — Discharge Instructions (Addendum)
Return for any problem.  Drink plenty of fluids.   Stop amoxicillin. Complete course of prednisone as previously prescribed. Start and complete clindamycin as instructed.

## 2021-07-15 ENCOUNTER — Other Ambulatory Visit: Payer: Self-pay

## 2021-07-15 ENCOUNTER — Ambulatory Visit
Admission: EM | Admit: 2021-07-15 | Discharge: 2021-07-15 | Disposition: A | Discharge status: Home / Self Care | Payer: BC Managed Care – PPO | Attending: Physician Assistant | Admitting: Physician Assistant

## 2021-07-15 DIAGNOSIS — J069 Acute upper respiratory infection, unspecified: Secondary | ICD-10-CM | POA: Diagnosis not present

## 2021-07-15 DIAGNOSIS — J4521 Mild intermittent asthma with (acute) exacerbation: Secondary | ICD-10-CM

## 2021-07-15 DIAGNOSIS — R051 Acute cough: Secondary | ICD-10-CM

## 2021-07-15 LAB — POCT INFLUENZA A/B
Influenza A, POC: NEGATIVE
Influenza B, POC: NEGATIVE

## 2021-07-15 MED ORDER — METHYLPREDNISOLONE SODIUM SUCC 125 MG IJ SOLR
80.0000 mg | Freq: Once | INTRAMUSCULAR | Status: AC
Start: 2021-07-15 — End: 2021-07-15
  Administered 2021-07-15: 80 mg via INTRAMUSCULAR

## 2021-07-15 MED ORDER — ALBUTEROL SULFATE HFA 108 (90 BASE) MCG/ACT IN AERS
2.0000 | INHALATION_SPRAY | Freq: Once | RESPIRATORY_TRACT | Status: AC
  Administered 2021-07-15: 2 via RESPIRATORY_TRACT

## 2021-07-15 MED ORDER — PREDNISONE 10 MG (21) PO TBPK: ORAL_TABLET | ORAL | 0 refills | Status: DC

## 2021-07-15 MED ORDER — PROMETHAZINE-DM 6.25-15 MG/5ML PO SYRP: 5.0000 mL | ORAL_SOLUTION | Freq: Four times a day (QID) | ORAL | 0 refills | Status: DC | PRN

## 2021-07-15 NOTE — ED Provider Notes (Signed)
[name]    CSN: [id number] Arrival date & time: 07/15/21  2409      History   Chief Complaint Chief Complaint  Patient presents with   Cough    HPI Marissa Benton is a 45 y.o. female.   Patient presents today with a 24-hour history of URI symptoms.  Reports headache, body aches, cough, shortness of breath, nausea, vomiting, diarrhea.  Denies any chest pain, dizziness, syncope.  She has tried Mucinex without improvement of symptoms.  She does have a history of asthma and has been using albuterol inhaler with temporary improvement of symptoms.  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  She is up-to-date on influenza and COVID-19 vaccine.  Has not had COVID in the past.  Denies history of diabetes.  She is having difficulty with daily activities including sleeping as result of symptoms.   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      Home Medications    Prior to Admission medications   Medication Sig Start Date End Date Taking? Authorizing Provider  predniSONE (STERAPRED UNI-PAK 21 TAB) 10 MG (21) TBPK tablet As directed 07/15/21  Yes Jobany Montellano K, PA-C  promethazine-dextromethorphan (PROMETHAZINE-DM) 6.25-15 MG/5ML syrup Take 5 mLs by mouth 4 (four) times daily as needed for cough. 07/15/21  Yes Braysen Cloward K, PA-C  albuterol (VENTOLIN HFA) 108 (90 Base) MCG/ACT inhaler Inhale 1-2 puffs into the lungs every 6 (six) hours  as needed for wheezing or shortness of breath. 03/02/20   Hall-Potvin, [place], PA-C  Cetirizine HCl 10 MG CAPS Take 1 capsule (10 mg total) by mouth daily for 10 days. 12/23/20 01/02/21  Wieters, Hallie C, PA-C  clindamycin (CLEOCIN) 150 MG capsule Take 1 capsule (150 mg total) by mouth 3 (three) times daily. 06/17/21   Wynetta Fines, MD  fluticasone (FLONASE) 50 MCG/ACT nasal spray Place 1-2 sprays into both nostrils daily. 12/23/20   Wieters, Hallie C, PA-C  ibuprofen (ADVIL) 800 MG tablet Take 1 tablet (800 mg total) by mouth 3 (three) times daily. 12/23/20   Wieters, Hallie C, PA-C  tiZANidine (ZANAFLEX) 4 MG tablet Take 1 tablet (4 mg total) by mouth every 6 (six) hours as needed for muscle spasms. 04/28/21   Wynetta Fines, MD  amitriptyline (ELAVIL) 75 MG tablet Take 1-2 tablets (75-150 mg total) by mouth at bedtime. 03/06/19 07/05/19  Bing Neighbors, FNP  DULoxetine (CYMBALTA) 30 MG capsule Take 1 capsule (30 mg total) by mouth daily. 07/05/19 03/02/20  Cathie Hoops, Amy V, PA-C  pantoprazole (PROTONIX) 40 MG tablet Take 1 tablet (40 mg total) by mouth daily. 09/02/18 03/02/20  Bing Neighbors, FNP    Family History Family History  Problem Relation Age of Onset   Healthy Mother    Heart disease Father    Kidney failure Father    Neuropathy Father     Social History  Social History   Tobacco Use   Smoking status: Former    Types: Cigarettes    Quit date: 08/27/2018    Years since quitting: 2.8   Smokeless tobacco: Never  Substance Use Topics   Alcohol use: Yes    Comment: occasion   Drug use: No     Allergies   Benadryl [diphenhydramine]   Review of Systems Review of Systems  Constitutional:  Positive for activity change, fatigue and fever. Negative for appetite change.  HENT:  Positive for congestion. Negative for sinus pressure, sneezing and sore throat.   Respiratory:  Positive for cough and shortness of breath.   Cardiovascular:  Negative for chest pain.  Gastrointestinal:   Positive for diarrhea, nausea and vomiting. Negative for abdominal pain.  Musculoskeletal:  Positive for arthralgias and myalgias.  Neurological:  Positive for headaches. Negative for dizziness and light-headedness.    Physical Exam Triage Vital Signs ED Triage Vitals [07/15/21 1103]  Enc Vitals Group     BP (!) 143/93     Pulse Rate 83     Resp 18     Temp 98.1 F (36.7 C)     Temp Source Oral     SpO2 97 %     Weight      Height      Head Circumference      Peak Flow      Pain Score 0     Pain Loc      Pain Edu?      Excl. in [place]?    No data found.  Updated Vital Signs BP (!) 143/93 (BP Location: Right Arm)   Pulse 83   Temp 98.1 F (36.7 C) (Oral)   Resp 18   SpO2 97%   Visual Acuity Right Eye Distance:   Left Eye Distance:   Bilateral Distance:    Right Eye Near:   Left Eye Near:    Bilateral Near:     Physical Exam Vitals reviewed.  Constitutional:      General: She is awake. She is not in acute distress.    Appearance: Normal appearance. She is well-developed. She is not ill-appearing.     Comments: Very pleasant female appears stated age no acute distress sitting comfortably in exam room  HENT:     Head: Normocephalic and atraumatic.     Right Ear: Tympanic membrane, ear canal and external ear normal. Tympanic membrane is not erythematous or bulging.     Left Ear: Tympanic membrane, ear canal and external ear normal. Tympanic membrane is not erythematous or bulging.     Nose:     Right Sinus: No maxillary sinus tenderness or frontal sinus tenderness.     Left Sinus: No maxillary sinus tenderness or frontal sinus tenderness.     Mouth/Throat:     Pharynx: Uvula midline. No oropharyngeal exudate or posterior oropharyngeal erythema.  Cardiovascular:     Rate and Rhythm: Normal rate and regular rhythm.     Heart sounds: Normal heart sounds, S1 normal and S2 normal. No murmur heard. Pulmonary:     Effort: Pulmonary effort is normal.     Breath sounds:  Wheezing present. No rhonchi or rales.     Comments: Widespread wheezing improved with albuterol in clinic. Psychiatric:        Behavior: Behavior is cooperative.     UC Treatments / Results  Labs (all labs ordered are listed, but only abnormal results are displayed) Labs Reviewed  NOVEL CORONAVIRUS, NAA  POCT INFLUENZA A/B    EKG   Radiology No results found.  Procedures Procedures (including critical care time)  Medications Ordered in UC Medications  albuterol (VENTOLIN HFA) 108 (90 Base) MCG/ACT inhaler 2 puff (2 puffs Inhalation Given 07/15/21 1141)  methylPREDNISolone sodium succinate (SOLU-MEDROL) 125 mg/2 mL injection 80 mg (80 mg Intramuscular Given 07/15/21 1134)    Initial Impression / Assessment and Plan / UC Course  I have reviewed the triage vital signs and the nursing notes.  Pertinent labs & imaging results that were available during my care of the patient were reviewed by me and considered in my medical decision making (see chart for details).     Flu testing was negative in clinic today.  COVID test obtained-results pending.  Patient was instructed to remain in isolation until COVID test results are obtained and was given work excuse note with current CDC return to work guidelines based on test result.  She was given Solu-Medrol and albuterol in clinic with improvement but not resolution of wheezing and reactive cough.  We will start prednisone taper.  She was instructed to avoid NSAIDs with this medication due to risk of GI bleeding.  She was given albuterol inhaler with instruction to use every 4-6 hours as needed.  Recommended she use Tylenol, Mucinex, Flonase for additional symptom relief.  Encouraged her to rest and drink plenty of fluid.  Discussed alarm symptoms that warrant emergent evaluation.  Strict return precautions given to which she expressed understanding.  Final Clinical Impressions(s) / UC Diagnoses   Final diagnoses:  Upper respiratory  tract infection, unspecified type  Acute cough  Mild intermittent asthma with acute exacerbation     Discharge Instructions      Your flu test was negative.  We will contact you if your COVID test is positive.  I am worried that your asthma is flared.  Please start prednisone taper tomorrow.  Take this as prescribed.  Do not take NSAIDs including aspirin, ibuprofen/Advil, naproxen/Aleve with this.  You can use Promethazine DM for cough.  This can make you sleepy do not drive or drink alcohol while taking it.  Use Mucinex, Flonase, Tylenol for additional symptom relief.  Make sure you are resting and drinking plenty of fluid.  If you have any worsening symptoms you need to return for reevaluation as we discussed.     ED Prescriptions     Medication Sig Dispense Auth. Provider   predniSONE (STERAPRED UNI-PAK 21 TAB) 10 MG (21) TBPK tablet As directed 21 tablet Sady Monaco K, PA-C   promethazine-dextromethorphan (PROMETHAZINE-DM) 6.25-15 MG/5ML syrup Take 5 mLs by mouth 4 (four) times daily as needed for cough. 118 mL Demarius Archila K, PA-C      PDMP not reviewed this encounter.   Terrilee Croak, PA-C 07/15/21 1209

## 2021-07-15 NOTE — Discharge Instructions (Signed)
Your flu test was negative.  We will contact you if your COVID test is positive.  I am worried that your asthma is flared.  Please start prednisone taper tomorrow.  Take this as prescribed.  Do not take NSAIDs including aspirin, ibuprofen/Advil, naproxen/Aleve with this.  You can use Promethazine DM for cough.  This can make you sleepy do not drive or drink alcohol while taking it.  Use Mucinex, Flonase, Tylenol for additional symptom relief.  Make sure you are resting and drinking plenty of fluid.  If you have any worsening symptoms you need to return for reevaluation as we discussed.

## 2021-07-15 NOTE — ED Triage Notes (Signed)
Pt c/o diarrhea, chills, sore throat, headache, body ache, cough, nausea.   Onset yesterday.

## 2021-07-16 LAB — SARS-COV-2, NAA 2 DAY TAT

## 2021-07-16 LAB — NOVEL CORONAVIRUS, NAA: SARS-CoV-2, NAA: NOT DETECTED

## 2021-09-15 ENCOUNTER — Ambulatory Visit
Admission: EM | Admit: 2021-09-15 | Discharge: 2021-09-15 | Disposition: A | Discharge status: Home / Self Care | Payer: BC Managed Care – PPO

## 2021-09-15 ENCOUNTER — Encounter: Payer: Self-pay | Admitting: Emergency Medicine

## 2021-09-15 ENCOUNTER — Emergency Department (HOSPITAL_BASED_OUTPATIENT_CLINIC_OR_DEPARTMENT_OTHER)
Admission: EM | Admit: 2021-09-15 | Discharge: 2021-09-15 | Disposition: A | Discharge status: Home / Self Care | Payer: BC Managed Care – PPO | Attending: Emergency Medicine | Admitting: Emergency Medicine

## 2021-09-15 ENCOUNTER — Encounter (HOSPITAL_BASED_OUTPATIENT_CLINIC_OR_DEPARTMENT_OTHER): Payer: Self-pay | Admitting: Emergency Medicine

## 2021-09-15 ENCOUNTER — Other Ambulatory Visit: Payer: Self-pay

## 2021-09-15 DIAGNOSIS — Y990 Civilian activity done for income or pay: Secondary | ICD-10-CM | POA: Insufficient documentation

## 2021-09-15 DIAGNOSIS — M542 Cervicalgia: Secondary | ICD-10-CM | POA: Diagnosis not present

## 2021-09-15 DIAGNOSIS — M79601 Pain in right arm: Secondary | ICD-10-CM

## 2021-09-15 DIAGNOSIS — X509XXA Other and unspecified overexertion or strenuous movements or postures, initial encounter: Secondary | ICD-10-CM | POA: Insufficient documentation

## 2021-09-15 DIAGNOSIS — M25511 Pain in right shoulder: Secondary | ICD-10-CM | POA: Insufficient documentation

## 2021-09-15 MED ORDER — METHOCARBAMOL 500 MG PO TABS: 500.0000 mg | ORAL_TABLET | Freq: Two times a day (BID) | ORAL | 0 refills | Status: DC

## 2021-09-15 NOTE — ED Triage Notes (Signed)
C/o right arm/shoulder pain radiating into neck. 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 Pt has an overall general tremor on inspection, states it's because she's in pain, and also states she normally has an underlying tremor. Guarding right arm

## 2021-09-15 NOTE — ED Triage Notes (Addendum)
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

## 2021-09-15 NOTE — ED Provider Notes (Signed)
[facility] EMERGENCY DEPT Provider Note   CSN: [id number] Arrival date & time: 09/15/21  4650     History  Chief Complaint  Patient presents with   Shoulder Pain    Marissa Benton is a 46 y.o. female.  46 year old female presents with right sided shoulder and neck discomfort.  Has had symptoms for several days which began after overactivity at work.  Denies any weakness in her right hand.  No other joint pain.  No history of arthritis.  Using over-the-counter medications without relief.  No fever or chills.      Home Medications Prior to Admission medications   Medication Sig Start Date End Date Taking? Authorizing Provider  albuterol (VENTOLIN HFA) 108 (90 Base) MCG/ACT inhaler Inhale 1-2 puffs into the lungs every 6 (six) hours as needed for wheezing or shortness of breath. 03/02/20   Hall-Potvin, [place], PA-C  Cetirizine HCl 10 MG CAPS Take 1 capsule (10 mg total) by mouth daily for 10 days. 12/23/20 01/02/21  Wieters, Hallie C, PA-C  clindamycin (CLEOCIN) 150 MG capsule Take 1 capsule (150 mg total) by mouth 3 (three) times daily. 06/17/21   Wynetta Fines, MD  fluticasone (FLONASE) 50 MCG/ACT nasal spray Place 1-2 sprays into both nostrils daily. 12/23/20   Wieters, Hallie C, PA-C  ibuprofen (ADVIL) 800 MG tablet Take 1 tablet (800 mg total) by mouth 3 (three) times daily. 12/23/20   Wieters, Hallie C, PA-C  predniSONE (STERAPRED UNI-PAK 21 TAB) 10 MG (21) TBPK tablet As directed 07/15/21   Raspet, Erin K, PA-C  promethazine-dextromethorphan (PROMETHAZINE-DM) 6.25-15 MG/5ML syrup Take 5 mLs by mouth 4 (four) times daily as needed for cough. 07/15/21   Raspet, Noberto Retort, PA-C  tiZANidine (ZANAFLEX) 4 MG tablet Take 1 tablet (4 mg total) by mouth every 6 (six) hours as needed for muscle spasms. 04/28/21   Wynetta Fines, MD  amitriptyline (ELAVIL) 75 MG tablet Take 1-2 tablets (75-150 mg total) by mouth at bedtime. 03/06/19 07/05/19  Bing Neighbors, FNP  DULoxetine  (CYMBALTA) 30 MG capsule Take 1 capsule (30 mg total) by mouth daily. 07/05/19 03/02/20  Cathie Hoops, Amy V, PA-C  pantoprazole (PROTONIX) 40 MG tablet Take 1 tablet (40 mg total) by mouth daily. 09/02/18 03/02/20  Bing Neighbors, FNP      Allergies    Benadryl [diphenhydramine]    Review of Systems   Review of Systems  All other systems reviewed and are negative.  Physical Exam Updated Vital Signs BP (!) 131/93 (BP Location: Right Arm)    Pulse 83    Temp 99.3 F (37.4 C)    Resp 18    SpO2 100%  Physical Exam Vitals and nursing note reviewed.  Constitutional:      General: She is not in acute distress.    Appearance: Normal appearance. She is well-developed. She is not toxic-appearing.  HENT:     Head: Normocephalic and atraumatic.  Eyes:     General: Lids are normal.     Conjunctiva/sclera: Conjunctivae normal.     Pupils: Pupils are equal, round, and reactive to light.  Neck:     Thyroid: No thyroid mass.     Trachea: No tracheal deviation.   Cardiovascular:     Rate and Rhythm: Normal rate and regular rhythm.     Heart sounds: Normal heart sounds. No murmur heard.   No gallop.  Pulmonary:     Effort: Pulmonary effort is normal. No respiratory distress.     Breath  sounds: Normal breath sounds. No stridor. No decreased breath sounds, wheezing, rhonchi or rales.  Abdominal:     General: There is no distension.     Palpations: Abdomen is soft.     Tenderness: There is no abdominal tenderness. There is no rebound.  Musculoskeletal:        General: No tenderness.     Right shoulder: No deformity or bony tenderness. Decreased range of motion.     Cervical back: Normal range of motion and neck supple. Muscular tenderness present.     Comments: Patient neurovascular intact at right hand.  Skin:    General: Skin is warm and dry.     Findings: No abrasion or rash.  Neurological:     Mental Status: She is alert and oriented to person, place, and time. Mental status is at baseline.      GCS: GCS eye subscore is 4. GCS verbal subscore is 5. GCS motor subscore is 6.     Cranial Nerves: No cranial nerve deficit.     Sensory: No sensory deficit.     Motor: Motor function is intact.  Psychiatric:        Attention and Perception: Attention normal.        Speech: Speech normal.        Behavior: Behavior normal.    ED Results / Procedures / Treatments   Labs (all labs ordered are listed, but only abnormal results are displayed) Labs Reviewed - No data to display  EKG None  Radiology No results found.  Procedures Procedures    Medications Ordered in ED Medications - No data to display  ED Course/ Medical Decision Making/ A&P                           Medical Decision Making  Patient with MSK pain without acute neurological findings.  Patient has baseline resting tremor at this time.  No known history of injury.  Neurovascular intact.  No indication for imaging at this time.  Patient comfortable with this and is agreeable to being prescribed muscle relaxants as well as being given a few days off work.  Return precautions given        Final Clinical Impression(s) / ED Diagnoses Final diagnoses:  None    Rx / DC Orders ED Discharge Orders     None         Lorre Nick, MD 09/15/21 1015

## 2021-09-15 NOTE — ED Provider Notes (Signed)
Patient here today for evaluation of right sided arm and shoulder pain. She has experienced same in the past and was informed it was most likely a muscular strain vs cervical radiculopathy. Patient states today her arm feels and appears more swollen and she has never had symptoms quite like she is experiencing today. Given worsened and new symptoms with PMH recommended further evaluation in the ED for stat imaging if needed. Patient expresses understanding.   Tomi Bamberger, PA-C 09/15/21 [phone]

## 2021-12-22 ENCOUNTER — Other Ambulatory Visit: Payer: Self-pay

## 2021-12-22 ENCOUNTER — Encounter (HOSPITAL_COMMUNITY): Payer: Self-pay | Admitting: Emergency Medicine

## 2021-12-22 ENCOUNTER — Emergency Department (HOSPITAL_COMMUNITY)
Admission: EM | Admit: 2021-12-22 | Discharge: 2021-12-22 | Disposition: A | Discharge status: Home / Self Care | Payer: BC Managed Care – PPO | Attending: Emergency Medicine | Admitting: Emergency Medicine

## 2021-12-22 DIAGNOSIS — M545 Low back pain, unspecified: Secondary | ICD-10-CM | POA: Diagnosis not present

## 2021-12-22 DIAGNOSIS — M549 Dorsalgia, unspecified: Secondary | ICD-10-CM | POA: Diagnosis not present

## 2021-12-22 DIAGNOSIS — M5459 Other low back pain: Secondary | ICD-10-CM | POA: Diagnosis not present

## 2021-12-22 LAB — URINALYSIS, MICROSCOPIC (REFLEX)

## 2021-12-22 LAB — URINALYSIS, ROUTINE W REFLEX MICROSCOPIC
Bilirubin Urine: NEGATIVE
Glucose, UA: NEGATIVE mg/dL
Ketones, ur: NEGATIVE mg/dL
Nitrite: NEGATIVE
Protein, ur: NEGATIVE mg/dL
Specific Gravity, Urine: 1.02 (ref 1.005–1.030)
pH: 6.5 (ref 5.0–8.0)

## 2021-12-22 MED ORDER — ONDANSETRON 4 MG PO TBDP
4.0000 mg | ORAL_TABLET | Freq: Once | ORAL | Status: AC
Start: 2021-12-22 — End: 2021-12-22
  Administered 2021-12-22: 4 mg via ORAL

## 2021-12-22 MED ORDER — LIDOCAINE HCL 4 % EX SOLN: Freq: Two times a day (BID) | CUTANEOUS | 0 refills | Status: DC | PRN

## 2021-12-22 MED ORDER — DICLOFENAC SODIUM 1 % EX GEL: 4.0000 g | Freq: Four times a day (QID) | CUTANEOUS | 0 refills | Status: DC

## 2021-12-22 MED ORDER — METHOCARBAMOL 500 MG PO TABS
750.0000 mg | ORAL_TABLET | Freq: Once | ORAL | Status: AC
  Administered 2021-12-22: 750 mg via ORAL
  Filled 2021-12-22: qty 2

## 2021-12-22 MED ORDER — LIDOCAINE 5 % EX PTCH
1.0000 | MEDICATED_PATCH | CUTANEOUS | Status: DC
  Administered 2021-12-22: 1 via TRANSDERMAL
  Filled 2021-12-22: qty 1

## 2021-12-22 MED ORDER — OXYCODONE-ACETAMINOPHEN 5-325 MG PO TABS
1.0000 | ORAL_TABLET | Freq: Once | ORAL | Status: AC
  Administered 2021-12-22: 1 via ORAL
  Filled 2021-12-22: qty 1

## 2021-12-22 MED ORDER — HYDROXYZINE HCL 25 MG PO TABS: 25.0000 mg | ORAL_TABLET | Freq: Four times a day (QID) | ORAL | 0 refills | Status: AC

## 2021-12-22 MED ORDER — FENTANYL CITRATE PF 50 MCG/ML IJ SOSY: 50.0000 ug | PREFILLED_SYRINGE | INTRAMUSCULAR | Status: DC

## 2021-12-22 MED ORDER — METHOCARBAMOL 500 MG PO TABS: 500.0000 mg | ORAL_TABLET | Freq: Two times a day (BID) | ORAL | 0 refills | Status: DC

## 2021-12-22 MED ORDER — ONDANSETRON 4 MG PO TBDP: 4.0000 mg | ORAL_TABLET | Freq: Three times a day (TID) | ORAL | 0 refills | Status: DC | PRN

## 2021-12-22 NOTE — Discharge Instructions (Addendum)
Your urine is without any evidence of infection.  Warm compresses, Tylenol ibuprofen, muscle relaxers, gentle stretching, gentle exercise, follow-up with physical therapy for your primary care provider.  If you do not have a primary care provider please follow-up with one.  ? ?I have written you a prescription for lidocaine patches, muscle relaxer, like you to use Tylenol and ibuprofen as discussed below ? ?Please use Tylenol or ibuprofen for pain.  You may use 600 mg ibuprofen every 6 hours or 1000 mg of Tylenol every 6 hours.  You may choose to alternate between the 2.  This would be most effective.  Not to exceed 4 g of Tylenol within 24 hours.  Not to exceed 3200 mg ibuprofen 24 hours.  ? ? ?

## 2021-12-22 NOTE — ED Provider Notes (Signed)
?[facility] EMERGENCY DEPARTMENT ?Provider Note ? ? ?CSN: [id number] ?Arrival date & time: 12/22/21  1039 ? ?  ? ?History ? ?Chief Complaint  ?Patient presents with  ? Back Pain  ? ? ?Marissa Benton is a 46 y.o. female. ? ? ?Back Pain ?Patient is a 46 year old female who denies any medical problems other than asthma presented emergency room today with complaints of back pain she states that her pain started around 4 AM this morning she states that she has had issues with back pain in the past.  She states that she has been doing lots of planting and moving heavy objects around her garden recently.  She states that this seems to have worsened her symptoms.  She states that she has tried some Tylenol and ibuprofen without significant relief.  She denies any chest pain or abdominal pain nausea vomiting diarrhea fevers chills.  No history of IV drug use.  No numbness or weakness she denies any trauma.  She is not a cancer patient ?  ? ?Home Medications ?Prior to Admission medications   ?Medication Sig Start Date End Date Taking? Authorizing Provider  ?diclofenac Sodium (VOLTAREN) 1 % GEL Apply 4 g topically 4 (four) times daily. 12/22/21  Yes Gailen Shelter, PA  ?hydrOXYzine (ATARAX) 25 MG tablet Take 1 tablet (25 mg total) by mouth every 6 (six) hours for 7 days. 12/22/21 12/29/21 Yes Ghalia Reicks, Stevphen Meuse S, PA  ?lidocaine (XYLOCAINE) 4 % external solution Apply topically 2 (two) times daily as needed. 12/22/21  Yes Danayah Smyre, Rodrigo Ran, PA  ?methocarbamol (ROBAXIN) 500 MG tablet Take 1 tablet (500 mg total) by mouth 2 (two) times daily. 12/22/21  Yes Tayveon Lombardo, Rodrigo Ran, PA  ?ondansetron (ZOFRAN-ODT) 4 MG disintegrating tablet Take 1 tablet (4 mg total) by mouth every 8 (eight) hours as needed for nausea or vomiting. 12/22/21  Yes Bobak Oguinn S, PA  ?albuterol (VENTOLIN HFA) 108 (90 Base) MCG/ACT inhaler Inhale 1-2 puffs into the lungs every 6 (six) hours as needed for wheezing or shortness of breath. 03/02/20    Hall-Potvin, [place], PA-C  ?Cetirizine HCl 10 MG CAPS Take 1 capsule (10 mg total) by mouth daily for 10 days. 12/23/20 01/02/21  Wieters, Hallie C, PA-C  ?clindamycin (CLEOCIN) 150 MG capsule Take 1 capsule (150 mg total) by mouth 3 (three) times daily. 06/17/21   Wynetta Fines, MD  ?fluticasone (FLONASE) 50 MCG/ACT nasal spray Place 1-2 sprays into both nostrils daily. 12/23/20   Wieters, Hallie C, PA-C  ?ibuprofen (ADVIL) 800 MG tablet Take 1 tablet (800 mg total) by mouth 3 (three) times daily. 12/23/20   Wieters, Hallie C, PA-C  ?predniSONE (STERAPRED UNI-PAK 21 TAB) 10 MG (21) TBPK tablet As directed 07/15/21   Raspet, Erin K, PA-C  ?promethazine-dextromethorphan (PROMETHAZINE-DM) 6.25-15 MG/5ML syrup Take 5 mLs by mouth 4 (four) times daily as needed for cough. 07/15/21   Raspet, Noberto Retort, PA-C  ?tiZANidine (ZANAFLEX) 4 MG tablet Take 1 tablet (4 mg total) by mouth every 6 (six) hours as needed for muscle spasms. 04/28/21   Wynetta Fines, MD  ?amitriptyline (ELAVIL) 75 MG tablet Take 1-2 tablets (75-150 mg total) by mouth at bedtime. 03/06/19 07/05/19  Bing Neighbors, FNP  ?DULoxetine (CYMBALTA) 30 MG capsule Take 1 capsule (30 mg total) by mouth daily. 07/05/19 03/02/20  Cathie Hoops, Amy V, PA-C  ?pantoprazole (PROTONIX) 40 MG tablet Take 1 tablet (40 mg total) by mouth daily. 09/02/18 03/02/20  Bing Neighbors, FNP  ?   ? ?  Allergies    ?Benadryl [diphenhydramine]   ? ?Review of Systems   ?Review of Systems  ?Musculoskeletal:  Positive for back pain.  ? ?Physical Exam ?Updated Vital Signs ?BP 129/76   Pulse 81   Temp 97.9 ?F (36.6 ?C) (Oral)   Resp 17   Ht 5\' 2"  (1.575 m)   Wt 87.5 kg   SpO2 100%   BMI 35.30 kg/m?  ?Physical Exam ?Vitals and nursing note reviewed.  ?Constitutional:   ?   General: She is not in acute distress. ?   Comments: Uncomfortable  ?HENT:  ?   Head: Normocephalic and atraumatic.  ?   Nose: Nose normal.  ?Eyes:  ?   General: No scleral icterus. ?Cardiovascular:  ?   Rate and Rhythm: Normal  rate and regular rhythm.  ?   Pulses: Normal pulses.  ?   Heart sounds: Normal heart sounds.  ?Pulmonary:  ?   Effort: Pulmonary effort is normal. No respiratory distress.  ?   Breath sounds: No wheezing.  ?Abdominal:  ?   Palpations: Abdomen is soft.  ?   Tenderness: There is no abdominal tenderness. There is no guarding or rebound.  ?Musculoskeletal:  ?   Cervical back: Normal range of motion.  ?   Right lower leg: No edema.  ?   Left lower leg: No edema.  ?   Comments: Diffuse muscular tenderness left-sided paravertebral musculature  ?Skin: ?   General: Skin is warm and dry.  ?   Capillary Refill: Capillary refill takes less than 2 seconds.  ?Neurological:  ?   Mental Status: She is alert. Mental status is at baseline.  ?Psychiatric:     ?   Mood and Affect: Mood normal.     ?   Behavior: Behavior normal.  ? ? ?ED Results / Procedures / Treatments   ?Labs ?(all labs ordered are listed, but only abnormal results are displayed) ?Labs Reviewed  ?URINALYSIS, ROUTINE W REFLEX MICROSCOPIC - Abnormal; Notable for the following components:  ?    Result Value  ? APPearance HAZY (*)   ? Hgb urine dipstick LARGE (*)   ? Leukocytes,Ua TRACE (*)   ? All other components within normal limits  ?URINALYSIS, MICROSCOPIC (REFLEX) - Abnormal; Notable for the following components:  ? Bacteria, UA RARE (*)   ? All other components within normal limits  ? ? ?EKG ?None ? ?Radiology ?No results found. ? ?Procedures ?Procedures  ? ? ?Medications Ordered in ED ?Medications  ?lidocaine (LIDODERM) 5 % 1 patch (1 patch Transdermal Patch Applied 12/22/21 1306)  ?fentaNYL (SUBLIMAZE) injection 50 mcg (0 mcg Intravenous Hold 12/22/21 1530)  ?oxyCODONE-acetaminophen (PERCOCET/ROXICET) 5-325 MG per tablet 1 tablet (1 tablet Oral Given 12/22/21 1154)  ?methocarbamol (ROBAXIN) tablet 750 mg (750 mg Oral Given 12/22/21 1307)  ?ondansetron (ZOFRAN-ODT) disintegrating tablet 4 mg (4 mg Oral Given 12/22/21 1319)  ? ? ?ED Course/ Medical Decision Making/  A&P ?  ?                        ?Medical Decision Making ?Amount and/or Complexity of Data Reviewed ?Labs: ordered. ? ?Risk ?Prescription drug management. ? ? ?Patient is a 46 year old female who denies any medical problems other than asthma presented emergency room today with complaints of back pain she states that her pain started around 4 AM this morning she states that she has had issues with back pain in the past.  She states that she has been  doing lots of planting and moving heavy objects around her garden recently.  She states that this seems to have worsened her symptoms.  She states that she has tried some Tylenol and ibuprofen without significant relief.  She denies any chest pain or abdominal pain nausea vomiting diarrhea fevers chills.  No history of IV drug use.  No numbness or weakness she denies any trauma.  She is not a cancer patient ? ?Patient with urinalysis with some hematuria.  Scrotal PCP. ? ?Broad differential for back pain considered includes malignancy, disc herniation, spinal epidural abscess, spinal fracture, cauda equina, pyelonephritis, kidney stone, AAA, AD, pancreatitis, PE and PTX.  ? ?History without symptoms of urinary or stool retention or incontinence, neurologic changes such as sensation change or weakness lower extremities, coagulopathy or blood thinner use, is not elderly or with history of osteoporosis, denies any history of cancer, fever, IV drug use, weight changes (unexplained), or prolonged steroid use.  ? ?Physical exam most consistent with muscular strain. Doubt cauda equina or disc herniation d/t lack of saddle anesthesia/bowel or bladder incontinence or urinary retention, normal gait and reassuring physical examination without neurologic deficits.  ? ?History is not supportive of kidney stone, AAA, AD, pancreatitis, PE or PTX. Patient has no CVA tenderness or urinary sx to suggest pyelonephritis or kidney stone.  ? ?Will manage patient conservatively at this time.  NSAIDs, back exercises/stretches, heat therapy and follow up with PCP if symptoms do not resolve in 3-4 weeks. Patient offered muscle relaxer for comfort at night. Counseled on need to return to ED for fever, worsening

## 2021-12-22 NOTE — ED Triage Notes (Signed)
Pt arrived POV with c/c of back pain. Per pt she states pain started at 0400 4/24, she stated she was planted flowers in her garden Saturday which she believed to have started her pain.  ?

## 2022-01-27 DIAGNOSIS — M797 Fibromyalgia: Secondary | ICD-10-CM | POA: Diagnosis not present

## 2022-01-27 DIAGNOSIS — Z1211 Encounter for screening for malignant neoplasm of colon: Secondary | ICD-10-CM | POA: Diagnosis not present

## 2022-01-27 DIAGNOSIS — M2550 Pain in unspecified joint: Secondary | ICD-10-CM | POA: Diagnosis not present

## 2022-01-27 DIAGNOSIS — Z0000 Encounter for general adult medical examination without abnormal findings: Secondary | ICD-10-CM | POA: Diagnosis not present

## 2022-01-27 DIAGNOSIS — Z1231 Encounter for screening mammogram for malignant neoplasm of breast: Secondary | ICD-10-CM | POA: Diagnosis not present

## 2022-02-06 DIAGNOSIS — Z1231 Encounter for screening mammogram for malignant neoplasm of breast: Secondary | ICD-10-CM | POA: Diagnosis not present

## 2022-08-10 ENCOUNTER — Encounter (HOSPITAL_COMMUNITY): Payer: Self-pay

## 2022-08-10 ENCOUNTER — Other Ambulatory Visit: Payer: Self-pay

## 2022-08-10 ENCOUNTER — Emergency Department (HOSPITAL_COMMUNITY): Payer: Self-pay

## 2022-08-10 ENCOUNTER — Emergency Department (HOSPITAL_COMMUNITY)
Admission: EM | Admit: 2022-08-10 | Discharge: 2022-08-10 | Disposition: A | Discharge status: Home / Self Care | Payer: Self-pay | Attending: Emergency Medicine | Admitting: Emergency Medicine

## 2022-08-10 DIAGNOSIS — M1711 Unilateral primary osteoarthritis, right knee: Secondary | ICD-10-CM | POA: Diagnosis not present

## 2022-08-10 DIAGNOSIS — Z7951 Long term (current) use of inhaled steroids: Secondary | ICD-10-CM | POA: Insufficient documentation

## 2022-08-10 DIAGNOSIS — X58XXXA Exposure to other specified factors, initial encounter: Secondary | ICD-10-CM | POA: Insufficient documentation

## 2022-08-10 DIAGNOSIS — J45909 Unspecified asthma, uncomplicated: Secondary | ICD-10-CM | POA: Insufficient documentation

## 2022-08-10 DIAGNOSIS — S86891A Other injury of other muscle(s) and tendon(s) at lower leg level, right leg, initial encounter: Secondary | ICD-10-CM | POA: Insufficient documentation

## 2022-08-10 MED ORDER — IBUPROFEN 800 MG PO TABS
800.0000 mg | ORAL_TABLET | Freq: Once | ORAL | Status: AC
  Administered 2022-08-10: 800 mg via ORAL
  Filled 2022-08-10: qty 1

## 2022-08-10 MED ORDER — IBUPROFEN 600 MG PO TABS: 600.0000 mg | ORAL_TABLET | Freq: Three times a day (TID) | ORAL | 0 refills | Status: AC | PRN

## 2022-08-10 NOTE — ED Triage Notes (Signed)
Pt arrived POV from home c/o right leg pain that started yesterday. Pt states she has a sharp pain that runs down from her knee to her ankle. PT denies any falls or injuries that she knows of.

## 2022-08-10 NOTE — ED Provider Notes (Signed)
[facility] EMERGENCY DEPARTMENT Provider Note   CSN: [id number] Arrival date & time: 08/10/22  1100     History  Chief Complaint  Patient presents with   Leg Pain    Marissa Benton is a 46 y.o. female.   Leg Pain    The patient has history of asthma, fibromyalgia, stroke who presents to the ED for evaluation of leg pain.  Patient states she has been doing a lot of walking at work recently.  She has been walking on hard floors.  She developed a sharp pain running from her knee down her anterior shin the last couple days.  Today it was more severe.  It hurts for her to walk and bear weight.  She has not had any fevers or chills.  No swelling.  No recent falls or injuries  Home Medications Prior to Admission medications   Medication Sig Start Date End Date Taking? Authorizing Provider  ibuprofen (ADVIL) 600 MG tablet Take 1 tablet (600 mg total) by mouth every 8 (eight) hours as needed for up to 7 days for fever, headache, mild pain or moderate pain. 08/10/22 08/17/22 Yes Linwood Dibbles, MD  albuterol (VENTOLIN HFA) 108 (90 Base) MCG/ACT inhaler Inhale 1-2 puffs into the lungs every 6 (six) hours as needed for wheezing or shortness of breath. 03/02/20   Hall-Potvin, [place], PA-C  Cetirizine HCl 10 MG CAPS Take 1 capsule (10 mg total) by mouth daily for 10 days. 12/23/20 01/02/21  Wieters, Hallie C, PA-C  clindamycin (CLEOCIN) 150 MG capsule Take 1 capsule (150 mg total) by mouth 3 (three) times daily. 06/17/21   Wynetta Fines, MD  diclofenac Sodium (VOLTAREN) 1 % GEL Apply 4 g topically 4 (four) times daily. 12/22/21   Fondaw, Rodrigo Ran, PA  fluticasone (FLONASE) 50 MCG/ACT nasal spray Place 1-2 sprays into both nostrils daily. 12/23/20   Wieters, Hallie C, PA-C  lidocaine (XYLOCAINE) 4 % external solution Apply topically 2 (two) times daily as needed. 12/22/21   Gailen Shelter, PA  methocarbamol (ROBAXIN) 500 MG tablet Take 1 tablet (500 mg total) by mouth 2 (two) times  daily. 12/22/21   Gailen Shelter, PA  ondansetron (ZOFRAN-ODT) 4 MG disintegrating tablet Take 1 tablet (4 mg total) by mouth every 8 (eight) hours as needed for nausea or vomiting. 12/22/21   Gailen Shelter, PA  predniSONE (STERAPRED UNI-PAK 21 TAB) 10 MG (21) TBPK tablet As directed 07/15/21   Raspet, Erin K, PA-C  promethazine-dextromethorphan (PROMETHAZINE-DM) 6.25-15 MG/5ML syrup Take 5 mLs by mouth 4 (four) times daily as needed for cough. 07/15/21   Raspet, Noberto Retort, PA-C  tiZANidine (ZANAFLEX) 4 MG tablet Take 1 tablet (4 mg total) by mouth every 6 (six) hours as needed for muscle spasms. 04/28/21   Wynetta Fines, MD  amitriptyline (ELAVIL) 75 MG tablet Take 1-2 tablets (75-150 mg total) by mouth at bedtime. 03/06/19 07/05/19  Bing Neighbors, FNP  DULoxetine (CYMBALTA) 30 MG capsule Take 1 capsule (30 mg total) by mouth daily. 07/05/19 03/02/20  Cathie Hoops, Amy V, PA-C  pantoprazole (PROTONIX) 40 MG tablet Take 1 tablet (40 mg total) by mouth daily. 09/02/18 03/02/20  Bing Neighbors, FNP      Allergies    Benadryl [diphenhydramine]    Review of Systems   Review of Systems  Physical Exam Updated Vital Signs BP (!) 140/100 (BP Location: Left Arm)   Pulse 67   Temp 98.3 F (36.8 C) (Oral)   Resp  16   Ht 1.575 m (5\' 2" )   Wt 86.2 kg   SpO2 100%   BMI 34.75 kg/m  Physical Exam Vitals and nursing note reviewed.  Constitutional:      General: She is not in acute distress.    Appearance: She is well-developed.  HENT:     Head: Normocephalic and atraumatic.     Right Ear: External ear normal.     Left Ear: External ear normal.  Eyes:     General: No scleral icterus.       Right eye: No discharge.        Left eye: No discharge.     Conjunctiva/sclera: Conjunctivae normal.  Neck:     Trachea: No tracheal deviation.  Cardiovascular:     Rate and Rhythm: Normal rate.  Pulmonary:     Effort: Pulmonary effort is normal. No respiratory distress.     Breath sounds: No stridor.   Abdominal:     General: There is no distension.  Musculoskeletal:        General: Tenderness present. No swelling or deformity.     Cervical back: Neck supple.     Comments: Tenderness palpation along the anterior aspect of the right lower leg along the tibia, no erythema, no knee effusion, no calf tenderness, extremity is warm and well-perfused  Skin:    General: Skin is warm and dry.     Findings: No rash.  Neurological:     Mental Status: She is alert.     Cranial Nerves: Cranial nerve deficit: no gross deficits.     ED Results / Procedures / Treatments   Labs (all labs ordered are listed, but only abnormal results are displayed) Labs Reviewed - No data to display  EKG None  Radiology DG Knee Complete 4 Views Right  Result Date: 08/10/2022 CLINICAL DATA:  Pain EXAM: RIGHT KNEE - COMPLETE 4+ VIEW COMPARISON:  None Available. FINDINGS: No recent fracture or dislocation is seen. There is no significant effusion. Degenerative changes are noted with bony spurs in lateral, medial and patellofemoral compartments. No focal lytic or sclerotic lesions are seen. Soft tissues are unremarkable. IMPRESSION: No recent fracture or dislocation is seen. Degenerative changes are noted with bony spurs, more prominent in the medial compartment. Electronically Signed   By: 14/07/2022 M.D.   On: 08/10/2022 13:25   DG Tibia/Fibula Right  Result Date: 08/10/2022 CLINICAL DATA:  Right lower leg pain for 1 day.  No injury EXAM: RIGHT TIBIA AND FIBULA - 2 VIEW COMPARISON:  None Available. FINDINGS: There is no evidence of fracture or other focal bone lesions. No malalignment. Soft tissues are unremarkable. IMPRESSION: Negative. Electronically Signed   By: 14/07/2022 D.O.   On: 08/10/2022 13:25    Procedures Procedures    Medications Ordered in ED Medications  ibuprofen (ADVIL) tablet 800 mg (800 mg Oral Given 08/10/22 1226)    ED Course/ Medical Decision Making/ A&P                            Medical Decision Making Problems Addressed: Arthritis of right knee: acute illness or injury Shin splint, right, initial encounter: acute illness or injury  Amount and/or Complexity of Data Reviewed Radiology: ordered and independent interpretation performed.    Details: Trays without signs of fracture.  Arthritis changes noted in the knee.  Risk Prescription drug management.   Without any recent trauma.  Exam is not suggestive of  infection.  She does not have any calf tenderness, I do not suspect a DVT.  Patient does have some tenderness along the anterior shin.  Is possible she has shinsplints from her recent increased activity at work.  Will have her try a course of NSAIDs.  I will provide a work note to have a rest for a couple of days.  Outpatient follow-up with a PCP or orthopedic doctor as needed        Final Clinical Impression(s) / ED Diagnoses Final diagnoses:  Shin splint, right, initial encounter  Arthritis of right knee    Rx / DC Orders ED Discharge Orders          Ordered    ibuprofen (ADVIL) 600 MG tablet  Every 8 hours PRN        08/10/22 1643              Linwood Dibbles, MD 08/10/22 1654

## 2022-08-10 NOTE — Discharge Instructions (Signed)
Take the medications as prescribed to help with your pain.  Try to rest your leg for the next few days.  Follow up with a primary care doctor or orthopedic doctor if the symptoms persist

## 2022-08-10 NOTE — ED Provider Triage Note (Signed)
Emergency Medicine Provider Triage Evaluation Note  Marissa Benton , a 46 y.o. female  was evaluated in triage.  Pt complains of R leg pain around anterior knee to R ankle. Denies swelling. No trauma. No rash, fever, chills. No chest pain, SOB>  Review of Systems  Positive: R ankle/knee pain Negative: Fever, chills  Physical Exam  BP (!) 154/98 (BP Location: Right Arm)   Pulse 73   Temp 98.3 F (36.8 C) (Oral)   Resp 16   Ht 5\' 2"  (1.575 m)   Wt 86.2 kg   SpO2 99%   BMI 34.75 kg/m  Gen:   Awake, no distress   Resp:  Normal effort  MSK:   Moves extremities without difficulty  Other:  Diffuse TTP of R knee, anterior tibia/fibula. No reduced ROM  Medical Decision Making  Medically screening exam initiated at 12:20 PM.  Appropriate orders placed.  Jacqueline Spofford was informed that the remainder of the evaluation will be completed by another provider, this initial triage assessment does not replace that evaluation, and the importance of remaining in the ED until their evaluation is complete.     Julienne Kass, Pete Pelt 08/10/22 1223

## 2022-11-09 ENCOUNTER — Ambulatory Visit: Payer: Medicaid Other | Admitting: Student

## 2022-11-09 ENCOUNTER — Encounter: Payer: Self-pay | Admitting: Student

## 2022-11-09 VITALS — BP 130/88 | HR 71 | Temp 98.2°F | Ht 62.0 in | Wt 194.7 lb

## 2022-11-09 DIAGNOSIS — Z87891 Personal history of nicotine dependence: Secondary | ICD-10-CM

## 2022-11-09 DIAGNOSIS — Z720 Tobacco use: Secondary | ICD-10-CM

## 2022-11-09 DIAGNOSIS — Z8673 Personal history of transient ischemic attack (TIA), and cerebral infarction without residual deficits: Secondary | ICD-10-CM | POA: Diagnosis not present

## 2022-11-09 DIAGNOSIS — G8929 Other chronic pain: Secondary | ICD-10-CM

## 2022-11-09 DIAGNOSIS — M797 Fibromyalgia: Secondary | ICD-10-CM

## 2022-11-09 MED ORDER — DULOXETINE HCL 20 MG PO CPEP: 20.0000 mg | ORAL_CAPSULE | Freq: Every day | ORAL | 2 refills | Status: AC

## 2022-11-09 NOTE — Patient Instructions (Signed)
Thank you, Ms.Yetta Numbers for allowing [place] to provide your care today. Today we discussed .    Chronic Pain From what you are describing, your chronic pain seems to align with fibromyalgia. Please try three things Daily exercise as you can tolerate with your pain Cognitive behavioral therapy (CBT) with a counselor, I have put in a referral Cymbalta 20 mg nightly  We will see you back in one month to see how you do with this   History of stroke I will try to get these records from the hospital on [address]. We will check your cholesterol levels today   I have ordered the following labs for you:   Lab Orders         Lipid Profile       Referrals ordered today:   Referral Orders  No referral(s) requested today     I have ordered the following medication/changed the following medications:   Stop the following medications: Medications Discontinued During This Encounter  Medication Reason   predniSONE (STERAPRED UNI-PAK 21 TAB) 10 MG (21) TBPK tablet    promethazine-dextromethorphan (PROMETHAZINE-DM) 6.25-15 MG/5ML syrup    ondansetron (ZOFRAN-ODT) 4 MG disintegrating tablet    methocarbamol (ROBAXIN) 500 MG tablet    lidocaine (XYLOCAINE) 4 % external solution    fluticasone (FLONASE) 50 MCG/ACT nasal spray    diclofenac Sodium (VOLTAREN) 1 % GEL    clindamycin (CLEOCIN) 150 MG capsule      Start the following medications: Meds ordered this encounter  Medications   DULoxetine (CYMBALTA) 20 MG capsule    Sig: Take 1 capsule (20 mg total) by mouth at bedtime.    Dispense:  30 capsule    Refill:  2     Follow up: 1 month   Should you have any questions or concerns please call the internal medicine clinic at [phone].    Sanjuana Letters, D.O. [place]

## 2022-11-10 DIAGNOSIS — G8929 Other chronic pain: Secondary | ICD-10-CM | POA: Insufficient documentation

## 2022-11-10 DIAGNOSIS — Z720 Tobacco use: Secondary | ICD-10-CM | POA: Insufficient documentation

## 2022-11-10 LAB — LIPID PANEL
Chol/HDL Ratio: 2.3 ratio (ref 0.0–4.4)
Cholesterol, Total: 174 mg/dL (ref 100–199)
HDL: 76 mg/dL (ref >39)
LDL Chol Calc (NIH): 79 mg/dL (ref 0–99)
Triglycerides: 108 mg/dL (ref 0–149)
VLDL Cholesterol Cal: 19 mg/dL (ref 5–40)

## 2022-11-10 NOTE — Assessment & Plan Note (Addendum)
Assessment: Patient endorses chronic pain since she was in her 20-30's. She describes the pain to be in multiple large/small joints and muscles of the neck, back, arms, and legs. She notes the pain varies from a burning pain to a sharp pain other times. She denies any rashes. She has been diagnosed with radiculopathy, sciatica in the past. She has had multiple extensive auto immune work ups with negative ANA, RF, CRP, sed rate, and anti-ccp IgG,IgA. I was able to find one mildly elevated CK to 339, but this normalized. At one point it was suggested she may have fibromyalgia.   Today she states she is finishing up a flair and has some soreness in her right trapezius. For her flairs she has not found any helpful medical therapies, she normally lays in bed and waits for the symptoms to resolve. Benign exam today.    Her symptoms and history does seem consistent with fibromyalgia. Dermatomyositis and polymyositis do not seem likely with her history (does not endorse weakness) and normal ANA. History not consistent with PMR, OA. Cervical and thoracic spine imaging not consistent with spondyloarthritis. Will continue to evaluate and if flair, would encourage her to schedule an appointment so a physical exam can be done during an active flair.  Prior treatments have consisted of amitriptyline and duloxetine. Both medications led to increased somnolence and she felt it was difficult to perform at her job. She also notes with the duloxetine she was taking it PRN up to 3 times a day and not on a consistent basis. Encouraged to take one 20 mg tablet QHS for multiple days, she agrees to this. Have also discussed exercising regularly and cognitive behavioral therapy.   Plan: - follow up one month for full muscle strength and reflex testing.  - cymbalta 20 mg QHS - IBH referral placed for CBT - encourage daily exercise, aerobic/weight lifting, gradually increase intensity - re-examine during active flair to try and  determine if alternative etiology other than fibromyalgia

## 2022-11-10 NOTE — Progress Notes (Signed)
CC: establish care  HPI:  Ms.Marissa Benton is a 47 y.o. female living with a history stated below and presents today to establish care in our clinic. Her medical history is pertinent for chronic pain and a recent suggested diagnosis of fibromylagia . Please see problem based assessment and plan for additional details.  Past Medical History:  Diagnosis Date   Asthma    Dyspepsia    Fibromyalgia    Stroke [facility])     Current Outpatient Medications on File Prior to Visit  Medication Sig Dispense Refill   albuterol (VENTOLIN HFA) 108 (90 Base) MCG/ACT inhaler Inhale 1-2 puffs into the lungs every 6 (six) hours as needed for wheezing or shortness of breath. 18 g 0   Cetirizine HCl 10 MG CAPS Take 1 capsule (10 mg total) by mouth daily for 10 days. 10 capsule 0   tiZANidine (ZANAFLEX) 4 MG tablet Take 1 tablet (4 mg total) by mouth every 6 (six) hours as needed for muscle spasms. 12 tablet 0   [DISCONTINUED] amitriptyline (ELAVIL) 75 MG tablet Take 1-2 tablets (75-150 mg total) by mouth at bedtime. 60 tablet 2   [DISCONTINUED] pantoprazole (PROTONIX) 40 MG tablet Take 1 tablet (40 mg total) by mouth daily. 30 tablet 3   No current facility-administered medications on file prior to visit.    Family History  Problem Relation Age of Onset   Healthy Mother    Heart disease Father        71's   Kidney failure Father    Neuropathy Father     Social History   Socioeconomic History   Marital status: Married    Spouse name: Not on file   Number of children: Not on file   Years of education: Not on file   Highest education level: Not on file  Occupational History   Not on file  Tobacco Use   Smoking status: Former    Packs/day: 1.00    Years: 30.00    Total pack years: 30.00    Types: Cigarettes   Smokeless tobacco: Never  Vaping Use   Vaping Use: Never used  Substance and Sexual Activity   Alcohol use: Yes    Alcohol/week: 7.0 standard drinks of alcohol    Types: 7  Glasses of wine per week    Comment: 1 glass of wine per night   Drug use: No   Sexual activity: Yes    Partners: Male  Other Topics Concern   Not on file  Social History Narrative   Not on file   Social Determinants of Health   Financial Resource Strain: Not on file  Food Insecurity: Not on file  Transportation Needs: Not on file  Physical Activity: Not on file  Stress: Not on file  Social Connections: Not on file  Intimate Partner Violence: Not on file    Review of Systems: ROS negative except for what is noted on the assessment and plan.  Vitals:   11/09/22 1544 11/09/22 1547 11/09/22 1638  BP: (!) 141/100 130/88 130/88  Pulse: 79 71   Temp: 98.2 F (36.8 C)    TempSrc: Oral    SpO2: 100%    Weight: 194 lb 11.2 oz (88.3 kg)    Height: '5\' 2"'$  (1.575 m)      Physical Exam: Constitutional: well-appearing, in no acute distress HENT: normocephalic atraumatic, mucous membranes moist Eyes: conjunctiva non-erythematous Neck: supple Cardiovascular: regular rate and rhythm, no m/r/g Pulmonary/Chest: normal work of breathing on room  air Abdominal: soft, non-tender, non-distended MSK: normal bulk and tone Neurological: alert & oriented x 3 Skin: warm and dry Psych: normal mood  Assessment & Plan:   Chronic pain Assessment: Patient endorses chronic pain since she was in her 20-30's. She describes the pain to be in multiple large/small joints and muscles of the neck, back, arms, and legs. She notes the pain varies from a burning pain to a sharp pain other times. She denies any rashes. She has been diagnosed with radiculopathy, sciatica in the past. She has had multiple extensive auto immune work ups with negative ANA, RF, CRP, sed rate, and anti-ccp IgG,IgA. I was able to find one mildly elevated CK to 339, but this normalized. At one point it was suggested she may have fibromyalgia.   Today she states she is finishing up a flair and has some soreness in her right  trapezius. For her flairs she has not found any helpful medical therapies, she normally lays in bed and waits for the symptoms to resolve. Benign exam today.    Her symptoms and history does seem consistent with fibromyalgia. Dermatomyositis and polymyositis do not seem likely with her history (does not endorse weakness) and normal ANA. History not consistent with PMR, OA. Cervical and thoracic spine imaging not consistent with spondyloarthritis. Will continue to evaluate and if new flair would encourage her to schedule an appointment so a physical exam can be done.   Prior treatments have consisted of amitriptyline and duloxetine. Both medications led to increased somnolence and she felt it was difficult to perform at her job. She also notes with the duloxetine she was taking it PRN up to 3 times a day and not on a consistent basis. Encouraged to take one 20 mg tablet QHS for multiple days, she agrees to this. Have also discussed exercising regularly and cognitive behavioral therapy.   Plan: - follow up one month for full muscle strength and reflex testing.  - cymbalta 20 mg QHS - IBH referral placed for CBT - encourage daily exercise, aerobic/weight lifting, gradually increase intensity - re-examine during active flair to try and determine if alternative etiology other than fibromyalgia  History of CVA (cerebrovascular accident) Assessment: Remote history of CVA in 2011 or 2012 in [place] at [facility]. She endorsed deficits that resolved without medications. She mentions not taking her prescribed medications during the hospital and her symptoms resolved. She was also mentions recovering on her own without the assistance of physical therapy. She denies any residual deficits. She is not on antiplatelet therapy or lipid lowering therapy. Unclear type of CVA that occurred.   Unable to see any documentation of this, record request to [facility] sent. From there can determine if she needs medical  therapies  Plan: - follow up notes from [facility]  Patient discussed with Dr. Laurena Slimmer, D.O. [facility], PGY-3 Phone: [phone] Date 11/10/2022 Time 9:27 AM

## 2022-11-10 NOTE — Assessment & Plan Note (Addendum)
Assessment: Remote history of CVA in 2011 or 2012 in [place] at [facility]. She endorsed deficits that resolved without medications. She mentions not taking her prescribed medications during the hospital and her symptoms resolved. She was also mentions recovering on her own without the assistance of physical therapy. She denies any residual deficits. She is not on antiplatelet therapy or lipid lowering therapy. Unclear type of CVA that occurred.   Unable to see any documentation of this, record request to [facility] sent. From there can determine if she needs medical therapies  Plan: - follow up notes from [facility] - if ischemic etiology, LDL not at goal - encourage lifestyle modifications

## 2022-11-10 NOTE — Assessment & Plan Note (Signed)
Assessment: Prior history of tobacco use. 1 ppd for 30 years. Recently quit, congratulated her on this.   Plan: -

## 2022-11-11 ENCOUNTER — Ambulatory Visit: Payer: Medicaid Other | Admitting: Family

## 2022-11-12 NOTE — Progress Notes (Signed)
Internal Medicine Clinic Attending  Case discussed with Dr. Katsadouros  At the time of the visit.  We reviewed the resident's history and exam and pertinent patient test results.  I agree with the assessment, diagnosis, and plan of care documented in the resident's note.  

## 2022-12-01 ENCOUNTER — Ambulatory Visit (INDEPENDENT_AMBULATORY_CARE_PROVIDER_SITE_OTHER): Payer: Medicaid Other | Admitting: Licensed Clinical Social Worker

## 2022-12-01 DIAGNOSIS — R4589 Other symptoms and signs involving emotional state: Secondary | ICD-10-CM

## 2022-12-01 NOTE — BH Specialist Note (Signed)
Integrated Behavioral Health via Telemedicine Visit  12/01/2022 [name] [id number]  Number of [place] Clinician visits: No data recorded Session Start time: 1000   Session End time: 1030  Total time in minutes: 30   Referring Provider: Axel Filler, MD Patient/Family location: Home [facility] Provider location: Office All persons participating in visit: [facility] and Patient Types of Service: Introduction only  I connected with Marissa Benton  via  Telephone or Video Enabled Telemedicine Application  (Video is Caregility application) and verified that I am speaking with the correct person using two identifiers. Discussed confidentiality: Yes   I discussed the limitations of telemedicine and the availability of in person appointments.  Discussed there is a possibility of technology failure and discussed alternative modes of communication if that failure occurs.  I discussed that engaging in this telemedicine visit, they consent to the provision of behavioral healthcare.  Patient and/or legal guardian expressed understanding and consented to Telemedicine visit: Yes   Presenting Concerns: Patient and/or family reports the following symptoms/concerns: Anxiety when having flair ups from diagnosis.    Patient and/or Family's Strengths/Protective Factors: Sense of purpose  Goals Addressed: Patient will:  Reduce symptoms of: anxiety   Progress towards Goals: Ongoing  Interventions: Interventions utilized:  Mindfulness or Relaxation Training and CBT Cognitive Behavioral Therapy Standardized Assessments completed: PHQ-SADS     11/09/2022    4:46 PM 03/06/2019    9:29 AM 09/02/2018   10:10 AM  PHQ-SADS Last 3 Score only  Total GAD-7 Score 1 1 0  PHQ Adolescent Score 2 3 1     Assessment: Patient currently experiencing anxiety with pain.   [facility] introduced self to patient and emailed patient [facility] direct contact information. [facility] reviewed  confidentiality. Patient stated she understood and agreed. [facility] scheduled patient for follow up visit for 04/25 at 2:30pm. Patient referred for CBT. Patient appreciates having someone to talk to and looking forward to ongoing visits with [facility].   Patient may benefit from ongoing counseling.  Plan: Follow up with behavioral health clinician on : 04/25 @ 2:30 Pm  I discussed the assessment and treatment plan with the patient and/or parent/guardian. They were provided an opportunity to ask questions and all were answered. They agreed with the plan and demonstrated an understanding of the instructions.   They were advised to call back or seek an in-person evaluation if the symptoms worsen or if the condition fails to improve as anticipated.  Milus Height, MSW, [place]  Internal Medicine Center Direct Dial:[phone]  Fax [phone] Main Office Phone: [phone] [address] [place], [place] [address] Website: [place], [place]

## 2022-12-07 ENCOUNTER — Encounter: Payer: Self-pay | Admitting: Licensed Clinical Social Worker

## 2022-12-07 NOTE — Progress Notes (Signed)
[facility] contacted patient on today to request to reschedule appointment. [facility] left two VM. Appointment rescheduled to 04/23 at 2pm via Telephone.  Christen Butter, MSW, LCSW-A She/Her Behavioral Health Clinician [facility]  Internal Medicine Center Direct Dial:[phone]  Fax [phone] Main Office Phone: [phone] [address] [place] [place], [place] [address] Website: [facility] Internal Medicine [facility]  [place], [place]  [place]

## 2022-12-14 ENCOUNTER — Ambulatory Visit
Admission: RE | Admit: 2022-12-14 | Discharge: 2022-12-14 | Disposition: A | Discharge status: Home / Self Care | Payer: Medicaid Other | Source: Ambulatory Visit | Attending: Family Medicine | Admitting: Family Medicine

## 2022-12-14 VITALS — BP 142/99 | HR 72 | Temp 98.0°F | Resp 16

## 2022-12-14 DIAGNOSIS — M25561 Pain in right knee: Secondary | ICD-10-CM

## 2022-12-14 NOTE — Discharge Instructions (Signed)
You may need to see your primary care to have a referral and authorization done so you can see orthopedics.

## 2022-12-14 NOTE — ED Triage Notes (Signed)
Pt c/o feeling knee "pop" on thurs. Pt states has hx of knee problems and self-placed a brace on the knee. States she is here today b/c her job requires provider note to return. Denies pain in triage. Says cannot see pcp until 23rd.

## 2022-12-14 NOTE — ED Provider Notes (Signed)
[name]    CSN: [id number] Arrival date & time: 12/14/22  6962      History   Chief Complaint Chief Complaint  Patient presents with   work note for knee    HPI Marissa Benton is a 47 y.o. female.   HPI On April 11 she was walking into work and her knee popped and then popped again.  She was sent home and she has been wearing a knee sleeve brace since then.  It does help her knee not as much.  No pain at this time.  She has never seen orthopedics about her knee.  She is interested in going back to work when scheduled on April 17.  Past Medical History:  Diagnosis Date   Asthma    Dyspepsia    Fibromyalgia    Stroke     Patient Active Problem List   Diagnosis Date Noted   Chronic pain 11/10/2022   Tobacco use 11/10/2022   Vitamin D deficiency 03/08/2019   Asthma 01/13/2016   Right cervical radiculopathy 01/13/2016   Bilateral sciatica 01/13/2016   History of CVA (cerebrovascular accident) 01/13/2016   Insomnia 01/13/2016    Past Surgical History:  Procedure Laterality Date   DILATION AND CURETTAGE OF UTERUS      OB History   No obstetric history on file.      Home Medications    Prior to Admission medications   Medication Sig Start Date End Date Taking? Authorizing Provider  albuterol (VENTOLIN HFA) 108 (90 Base) MCG/ACT inhaler Inhale 1-2 puffs into the lungs every 6 (six) hours as needed for wheezing or shortness of breath. 03/02/20   Hall-Potvin, [place], PA-C  Cetirizine HCl 10 MG CAPS Take 1 capsule (10 mg total) by mouth daily for 10 days. 12/23/20 01/02/21  Wieters, Hallie C, PA-C  DULoxetine (CYMBALTA) 20 MG capsule Take 1 capsule (20 mg total) by mouth at bedtime. 11/09/22 11/09/23  Belva Agee, MD  tiZANidine (ZANAFLEX) 4 MG tablet Take 1 tablet (4 mg total) by mouth every 6 (six) hours as needed for muscle spasms. 04/28/21   Wynetta Fines, MD  amitriptyline (ELAVIL) 75 MG tablet Take 1-2 tablets (75-150 mg total) by  mouth at bedtime. 03/06/19 07/05/19  Bing Neighbors, NP  pantoprazole (PROTONIX) 40 MG tablet Take 1 tablet (40 mg total) by mouth daily. 09/02/18 03/02/20  Bing Neighbors, NP    Family History Family History  Problem Relation Age of Onset   Healthy Mother    Heart disease Father        9's   Kidney failure Father    Neuropathy Father     Social History Social History   Tobacco Use   Smoking status: Former    Packs/day: 1.00    Years: 30.00    Additional pack years: 0.00    Total pack years: 30.00    Types: Cigarettes   Smokeless tobacco: Never  Vaping Use   Vaping Use: Never used  Substance Use Topics   Alcohol use: Yes    Alcohol/week: 7.0 standard drinks of alcohol    Types: 7 Glasses of wine per week    Comment: 1 glass of wine per night   Drug use: No     Allergies   Benadryl [diphenhydramine]   Review of Systems Review of Systems   Physical Exam Triage Vital Signs ED Triage Vitals [12/14/22 1005]  Enc Vitals Group     BP (!) 142/99  Pulse Rate 72     Resp 16     Temp 98 F (36.7 C)     Temp Source Oral     SpO2 95 %     Weight      Height      Head Circumference      Peak Flow      Pain Score 0     Pain Loc      Pain Edu?      Excl. in GC?    No data found.  Updated Vital Signs BP (!) 142/99 (BP Location: Left Arm)   Pulse 72   Temp 98 F (36.7 C) (Oral)   Resp 16   SpO2 95%   Visual Acuity Right Eye Distance:   Left Eye Distance:   Bilateral Distance:    Right Eye Near:   Left Eye Near:    Bilateral Near:     Physical Exam Vitals reviewed.  Constitutional:      General: She is not in acute distress.    Appearance: She is not ill-appearing, toxic-appearing or diaphoretic.  Musculoskeletal:     Right lower leg: No edema.     Left lower leg: No edema.     Comments: Knee sleeve brace in place  Skin:    Coloration: Skin is not pale.  Neurological:     Mental Status: She is alert and oriented to person, place, and  time.  Psychiatric:        Behavior: Behavior normal.      UC Treatments / Results  Labs (all labs ordered are listed, but only abnormal results are displayed) Labs Reviewed - No data to display  EKG   Radiology No results found.  Procedures Procedures (including critical care time)  Medications Ordered in UC Medications - No data to display  Initial Impression / Assessment and Plan / UC Course  I have reviewed the triage vital signs and the nursing notes.  Pertinent labs & imaging results that were available during my care of the patient were reviewed by me and considered in my medical decision making (see chart for details).        Work note is done.  She is given contact information for orthopedics.  It does not like with her medical coverage she will need referral from her primary care. Final Clinical Impressions(s) / UC Diagnoses   Final diagnoses:  Acute pain of right knee     Discharge Instructions      You may need to see your primary care to have a referral and authorization done so you can see orthopedics.     ED Prescriptions   None    PDMP not reviewed this encounter.   Zenia Resides, MD 12/14/22 [phone]

## 2022-12-22 ENCOUNTER — Encounter: Payer: Medicaid Other | Admitting: Internal Medicine

## 2022-12-22 ENCOUNTER — Ambulatory Visit (INDEPENDENT_AMBULATORY_CARE_PROVIDER_SITE_OTHER): Payer: Medicaid Other | Admitting: Licensed Clinical Social Worker

## 2022-12-22 DIAGNOSIS — R4589 Other symptoms and signs involving emotional state: Secondary | ICD-10-CM

## 2022-12-22 NOTE — Progress Notes (Deleted)
   CC: work clearance  HPI:Ms.Marissa Benton is a 47 y.o. female who presents for evaluation of ***. Please see individual problem based A/P for details.  Hx cva, sciatica, radiculopathy    Depression, PHQ-9: Based on the patients  Flowsheet Row Office Visit from 11/09/2022 in [facility]  PHQ-9 Total Score 2      score we have ***.  Past Medical History:  Diagnosis Date   Asthma    Dyspepsia    Fibromyalgia    Stroke    Review of Systems:   ROS   Physical Exam: There were no vitals filed for this visit.   General: *** HEENT: Conjunctiva nl , antiicteric sclerae, moist mucous membranes, no exudate or erythema Cardiovascular: Normal rate, regular rhythm.  No murmurs, rubs, or gallops Pulmonary : Equal breath sounds, No wheezes, rales, or rhonchi Abdominal: soft, nontender,  bowel sounds present Ext: No edema in lower extremities, no tenderness to palpation of lower extremities.   Assessment & Plan:   See Encounters Tab for problem based charting.  Patient {GC/GE:[id number]::"discussed with","seen with"} Dr. {[id number]::"F. Hoffman","Guilloud","Mullen","Narendra","Raines","Vincent","Williams"}

## 2022-12-22 NOTE — BH Specialist Note (Signed)
Patient no-showed today's appointment; appointment was for Telephone visit at 2:00 Pm     [facility] contacted patient from telephone number [phone]. [facility] left a  VM.   Patient will need to reschedule appointment by calling Internal medicine center [phone].  Christen Butter, MSW, LCSW-A She/Her Behavioral Health Clinician [facility]  Internal Medicine Center Direct Dial:[phone]  Fax [phone] Main Office Phone: [phone] [address] [place] [place], [place] [address] Website: [employer] [facility] Internal Medicine [facility]  [place], [place]  [place]

## 2022-12-24 ENCOUNTER — Ambulatory Visit: Payer: Medicaid Other | Admitting: Licensed Clinical Social Worker

## 2023-02-09 ENCOUNTER — Ambulatory Visit
Admission: EM | Admit: 2023-02-09 | Discharge: 2023-02-09 | Disposition: A | Discharge status: Home / Self Care | Payer: Medicaid Other | Attending: Internal Medicine | Admitting: Internal Medicine

## 2023-02-09 DIAGNOSIS — H6593 Unspecified nonsuppurative otitis media, bilateral: Secondary | ICD-10-CM | POA: Diagnosis not present

## 2023-02-09 DIAGNOSIS — J069 Acute upper respiratory infection, unspecified: Secondary | ICD-10-CM

## 2023-02-09 DIAGNOSIS — R519 Headache, unspecified: Secondary | ICD-10-CM

## 2023-02-09 MED ORDER — AMOXICILLIN-POT CLAVULANATE 875-125 MG PO TABS: 1.0000 | ORAL_TABLET | Freq: Two times a day (BID) | ORAL | 0 refills | Status: AC

## 2023-02-09 MED ORDER — FLUTICASONE PROPIONATE 50 MCG/ACT NA SUSP
1.0000 | Freq: Every day | NASAL | 0 refills | Status: AC
Start: 2023-02-09 — End: ?

## 2023-02-09 NOTE — ED Provider Notes (Signed)
[name]    CSN: [id number] Arrival date & time: 02/09/23  0849      History   Chief Complaint Chief Complaint  Patient presents with   Facial Pain   Headache    HPI Marissa Benton is a 47 y.o. female.   Patient presents with nasal congestion, facial pain and pressure, sore throat, headache, right ear pain that started yesterday.  Patient denies cough, fever.  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  She took over-the-counter ibuprofen and TheraFlu with minimal improvement in symptoms.  Reports that she has noticed some upper facial swelling as well.   Headache   Past Medical History:  Diagnosis Date   Asthma    Dyspepsia    Fibromyalgia    Stroke [facility])     Patient Active Problem List   Diagnosis Date Noted   Chronic pain 11/10/2022   Tobacco use 11/10/2022   Vitamin D deficiency 03/08/2019   Asthma 01/13/2016   Right cervical radiculopathy 01/13/2016   Bilateral sciatica 01/13/2016   History of CVA (cerebrovascular accident) 01/13/2016   Insomnia 01/13/2016    Past Surgical History:  Procedure Laterality Date   DILATION AND CURETTAGE OF UTERUS      OB History   No obstetric history on file.      Home Medications    Prior to Admission medications   Medication Sig Start Date End Date Taking? Authorizing Provider  amoxicillin-clavulanate (AUGMENTIN) 875-125 MG tablet Take 1 tablet by mouth every 12 (twelve) hours. 02/09/23  Yes Milcah Dulany, Rolly Salter E, FNP  DULoxetine (CYMBALTA) 20 MG capsule Take 1 capsule (20 mg total) by mouth at bedtime. 11/09/22 11/09/23 Yes Katsadouros, Vasilios, MD  fluticasone (FLONASE) 50 MCG/ACT nasal spray Place 1 spray into both nostrils daily. 02/09/23  Yes Merik Mignano, Acie Fredrickson, FNP  albuterol (VENTOLIN HFA) 108 (90 Base) MCG/ACT inhaler Inhale 1-2 puffs into the lungs every 6 (six) hours as needed for wheezing or shortness of breath. 03/02/20   Hall-Potvin, [place],  PA-C  Cetirizine HCl 10 MG CAPS Take 1 capsule (10 mg total) by mouth daily for 10 days. 12/23/20 01/02/21  Wieters, Hallie C, PA-C  tiZANidine (ZANAFLEX) 4 MG tablet Take 1 tablet (4 mg total) by mouth every 6 (six) hours as needed for muscle spasms. 04/28/21   Wynetta Fines, MD  amitriptyline (ELAVIL) 75 MG tablet Take 1-2 tablets (75-150 mg total) by mouth at bedtime. 03/06/19 07/05/19  Bing Neighbors, NP  pantoprazole (PROTONIX) 40 MG tablet Take 1 tablet (40 mg total) by mouth daily. 09/02/18 03/02/20  Bing Neighbors, NP    Family History Family History  Problem Relation Age of Onset   Healthy Mother    Heart disease Father        12's   Kidney failure Father    Neuropathy Father     Social History Social History   Tobacco Use   Smoking status: Former    Packs/day: 1.00    Years: 30.00    Additional pack years: 0.00    Total pack years: 30.00    Types: Cigarettes   Smokeless tobacco: Never  Vaping Use   Vaping Use: Never used  Substance Use Topics   Alcohol use: Yes    Alcohol/week: 7.0 standard drinks of alcohol    Types: 7 Glasses of wine per week    Comment: 1 glass of wine per night   Drug  use: No     Allergies   Benadryl [diphenhydramine]   Review of Systems Review of Systems Per HPI  Physical Exam Triage Vital Signs ED Triage Vitals [02/09/23 0939]  Enc Vitals Group     BP (!) 153/90     Pulse Rate 67     Resp 18     Temp 98.1 F (36.7 C)     Temp Source Oral     SpO2 96 %     Weight      Height      Head Circumference      Peak Flow      Pain Score      Pain Loc      Pain Edu?      Excl. in GC?    No data found.  Updated Vital Signs BP (!) 153/90 (BP Location: Left Arm)   Pulse 67   Temp 98.1 F (36.7 C) (Oral)   Resp 18   SpO2 96%   Visual Acuity Right Eye Distance:   Left Eye Distance:   Bilateral Distance:    Right Eye Near:   Left Eye Near:    Bilateral Near:     Physical Exam Constitutional:      General: She is  not in acute distress.    Appearance: Normal appearance. She is not toxic-appearing or diaphoretic.  HENT:     Head: Normocephalic and atraumatic.     Right Ear: Ear canal normal. No drainage, swelling or tenderness. A middle ear effusion is present. Tympanic membrane is bulging. Tympanic membrane is not perforated or erythematous.     Left Ear: Ear canal normal. No drainage, swelling or tenderness. A middle ear effusion is present. Tympanic membrane is bulging. Tympanic membrane is not perforated or erythematous.     Ears:     Comments: No facial swelling noted.     Nose: Congestion present.     Right Sinus: Maxillary sinus tenderness and frontal sinus tenderness present.     Left Sinus: Maxillary sinus tenderness and frontal sinus tenderness present.     Mouth/Throat:     Mouth: Mucous membranes are moist.     Pharynx: No posterior oropharyngeal erythema.  Eyes:     Extraocular Movements: Extraocular movements intact.     Conjunctiva/sclera: Conjunctivae normal.     Pupils: Pupils are equal, round, and reactive to light.  Cardiovascular:     Rate and Rhythm: Normal rate and regular rhythm.     Pulses: Normal pulses.     Heart sounds: Normal heart sounds.  Pulmonary:     Effort: Pulmonary effort is normal. No respiratory distress.     Breath sounds: Normal breath sounds. No wheezing.  Abdominal:     General: Abdomen is flat. Bowel sounds are normal.     Palpations: Abdomen is soft.  Musculoskeletal:        General: Normal range of motion.     Cervical back: Normal range of motion.  Skin:    General: Skin is warm and dry.  Neurological:     General: No focal deficit present.     Mental Status: She is alert and oriented to person, place, and time. Mental status is at baseline.  Psychiatric:        Mood and Affect: Mood normal.        Behavior: Behavior normal.      UC Treatments / Results  Labs (all labs ordered are listed, but only abnormal results are displayed) Labs  Reviewed - No data to display  EKG   Radiology No results found.  Procedures Procedures (including critical care time)  Medications Ordered in UC Medications - No data to display  Initial Impression / Assessment and Plan / UC Course  I have reviewed the triage vital signs and the nursing notes.  Pertinent labs & imaging results that were available during my care of the patient were reviewed by me and considered in my medical decision making (see chart for details).     Differential diagnoses include sinus infection versus viral upper respiratory infection.  Given amount of facial pressure and discomfort patient is in, I am concerned for sinus infection so will opt to treat with Augmentin antibiotic.  Although, discussed with patient that viral illness could be etiology of symptoms.  Will prescribe Augmentin antibiotic.  Flonase prescribed to help alleviate sinus pressure and fluid behind eardrums as well.  Advised strict follow-up precautions.  Patient verbalized understanding and was agreeable with plan. Final Clinical Impressions(s) / UC Diagnoses   Final diagnoses:  Facial pain  Fluid level behind tympanic membrane of both ears  Acute upper respiratory infection     Discharge Instructions      I have prescribed an antibiotic to help alleviate your symptoms as well as a nasal spray.  Follow-up if any symptoms persist or worsen.    ED Prescriptions     Medication Sig Dispense Auth. Provider   amoxicillin-clavulanate (AUGMENTIN) 875-125 MG tablet Take 1 tablet by mouth every 12 (twelve) hours. 14 tablet Prien, Pendleton E, [place]   fluticasone [facility]) 50 MCG/ACT nasal spray Place 1 spray into both nostrils daily. 16 g Gustavus Bryant, [place]      PDMP not reviewed this encounter.   Gustavus Bryant, [place] 02/09/23 1048

## 2023-02-09 NOTE — Discharge Instructions (Signed)
I have prescribed an antibiotic to help alleviate your symptoms as well as a nasal spray.  Follow-up if any symptoms persist or worsen.

## 2023-02-09 NOTE — ED Triage Notes (Signed)
Pt presents to office for facial swelling and ear pain x 2 days. Pt reports a headache.

## 2023-04-09 DIAGNOSIS — G8929 Other chronic pain: Secondary | ICD-10-CM | POA: Diagnosis not present

## 2023-04-09 DIAGNOSIS — M7732 Calcaneal spur, left foot: Secondary | ICD-10-CM | POA: Diagnosis not present

## 2023-04-09 DIAGNOSIS — M79605 Pain in left leg: Secondary | ICD-10-CM | POA: Diagnosis not present

## 2023-04-23 ENCOUNTER — Emergency Department (HOSPITAL_COMMUNITY)
Admission: EM | Admit: 2023-04-23 | Discharge: 2023-04-23 | Disposition: A | Discharge status: Home / Self Care | Payer: Medicaid Other | Attending: Emergency Medicine | Admitting: Emergency Medicine

## 2023-04-23 ENCOUNTER — Emergency Department (HOSPITAL_COMMUNITY): Payer: Medicaid Other

## 2023-04-23 ENCOUNTER — Other Ambulatory Visit: Payer: Self-pay

## 2023-04-23 ENCOUNTER — Encounter (HOSPITAL_COMMUNITY): Payer: Self-pay | Admitting: *Deleted

## 2023-04-23 DIAGNOSIS — S8392XA Sprain of unspecified site of left knee, initial encounter: Secondary | ICD-10-CM | POA: Diagnosis not present

## 2023-04-23 DIAGNOSIS — S838X2A Sprain of other specified parts of left knee, initial encounter: Secondary | ICD-10-CM | POA: Diagnosis not present

## 2023-04-23 DIAGNOSIS — X58XXXA Exposure to other specified factors, initial encounter: Secondary | ICD-10-CM | POA: Insufficient documentation

## 2023-04-23 DIAGNOSIS — Z8673 Personal history of transient ischemic attack (TIA), and cerebral infarction without residual deficits: Secondary | ICD-10-CM | POA: Diagnosis not present

## 2023-04-23 DIAGNOSIS — M25562 Pain in left knee: Secondary | ICD-10-CM | POA: Diagnosis present

## 2023-04-23 DIAGNOSIS — Z7951 Long term (current) use of inhaled steroids: Secondary | ICD-10-CM | POA: Diagnosis not present

## 2023-04-23 DIAGNOSIS — J45909 Unspecified asthma, uncomplicated: Secondary | ICD-10-CM | POA: Insufficient documentation

## 2023-04-23 MED ORDER — ACETAMINOPHEN 500 MG PO TABS
1000.0000 mg | ORAL_TABLET | Freq: Once | ORAL | Status: AC
  Administered 2023-04-23: 1000 mg via ORAL
  Filled 2023-04-23: qty 2

## 2023-04-23 MED ORDER — IBUPROFEN 400 MG PO TABS
600.0000 mg | ORAL_TABLET | Freq: Once | ORAL | Status: AC
  Administered 2023-04-23: 600 mg via ORAL
  Filled 2023-04-23: qty 1

## 2023-04-23 NOTE — Discharge Instructions (Addendum)
You were seen in the emergency department for your knee pain.  Your x-ray showed a small amount of arthritis but I think it is likely that you sprained or partially tore one of the ligaments in your knee and I have given you a knee immobilizer to use as well as crutches as needed.  You can take off the immobilizer to sleep and shower but you should wear it while you are up on your feet.  You should follow-up with orthopedics to have your knee rechecked and you can continue taking Tylenol and Motrin as needed for pain and icing your knee and keeping it elevated to help with the swelling.  You should return to the emergency department if you have numbness or weakness in your leg or any other new or concerning symptoms.

## 2023-04-23 NOTE — ED Provider Notes (Signed)
Marissa Benton Provider Note   CSN: [id number] Arrival date & time: 04/23/23  0845     History  Chief Complaint  Patient presents with   Knee Pain    ERLENE Benton is a 47 y.o. female.  Patient is a 47 year old female with a past medical history of asthma, CVA and fibromyalgia presenting to the emergency department with left knee pain.  Patient states that she has had ongoing pain in her left knee and shin for the last 2 weeks.  She states that she was initially seen at urgent care and had an x-ray of her tib-fib that was normal and told that she was likely having shinsplints.  She states that she has been Ace wrapping her leg, icing it and keeping it elevated and taking Motrin as needed for pain was cleared to go back to work.  She states that she is on her feet a lot during the workday.  She denies any trauma or falls.  She states the last few days her left knee has started to swell and the pain has been worsening.  She states the pain is worse whenever she bears weight on her knee.  She denies any numbness or weakness, fevers or chills.  The history is provided by the patient and the spouse.  Knee Pain      Home Medications Prior to Admission medications   Medication Sig Start Date End Date Taking? Authorizing Provider  albuterol (VENTOLIN HFA) 108 (90 Base) MCG/ACT inhaler Inhale 1-2 puffs into the lungs every 6 (six) hours as needed for wheezing or shortness of breath. 03/02/20   Hall-Potvin, [place], PA-C  amoxicillin-clavulanate (AUGMENTIN) 875-125 MG tablet Take 1 tablet by mouth every 12 (twelve) hours. 02/09/23   Gustavus Bryant, FNP  Cetirizine HCl 10 MG CAPS Take 1 capsule (10 mg total) by mouth daily for 10 days. 12/23/20 01/02/21  Wieters, Hallie C, PA-C  DULoxetine (CYMBALTA) 20 MG capsule Take 1 capsule (20 mg total) by mouth at bedtime. 11/09/22 11/09/23  Belva Agee, MD  fluticasone (FLONASE) 50 MCG/ACT nasal spray Place  1 spray into both nostrils daily. 02/09/23   Gustavus Bryant, FNP  tiZANidine (ZANAFLEX) 4 MG tablet Take 1 tablet (4 mg total) by mouth every 6 (six) hours as needed for muscle spasms. 04/28/21   Wynetta Fines, MD  amitriptyline (ELAVIL) 75 MG tablet Take 1-2 tablets (75-150 mg total) by mouth at bedtime. 03/06/19 07/05/19  Bing Neighbors, NP  pantoprazole (PROTONIX) 40 MG tablet Take 1 tablet (40 mg total) by mouth daily. 09/02/18 03/02/20  Bing Neighbors, NP      Allergies    Benadryl [diphenhydramine]    Review of Systems   Review of Systems  Physical Exam Updated Vital Signs BP (!) 155/142 (BP Location: Right Arm)   Pulse 80   Temp 98.3 F (36.8 C) (Oral)   Resp 20   Ht 5\' 2"  (1.575 m)   Wt 90.7 kg   SpO2 100%   BMI 36.58 kg/m  Physical Exam Vitals and nursing note reviewed.  Constitutional:      General: She is not in acute distress.    Appearance: Normal appearance.  HENT:     Head: Normocephalic and atraumatic.     Nose: Nose normal.  Eyes:     Extraocular Movements: Extraocular movements intact.  Cardiovascular:     Rate and Rhythm: Normal rate.     Pulses: Normal pulses.  Pulmonary:     Effort: Pulmonary effort is normal.  Musculoskeletal:        General: Normal range of motion.     Cervical back: Normal range of motion.     Comments: No bony tenderness to right lower extremity Tenderness to palpation along lateral aspect of left knee with small palpable knee joint effusion, knee flexion/extension intact No knee joint laxity however increased pain with varus, no pain with McMurray test  Skin:    General: Skin is warm and dry.     Findings: No erythema.  Neurological:     General: No focal deficit present.     Mental Status: She is alert and oriented to person, place, and time.  Psychiatric:        Mood and Affect: Mood normal.        Behavior: Behavior normal.     ED Results / Procedures / Treatments   Labs (all labs ordered are listed, but only  abnormal results are displayed) Labs Reviewed - No data to display  EKG None  Radiology DG Knee Complete 4 Views Left  Result Date: 04/23/2023 CLINICAL DATA:  Knee pain and swelling EXAM: LEFT KNEE - COMPLETE 4 VIEW COMPARISON:  None Available. FINDINGS: No fracture or dislocation. Preserved joint spaces and bone mineralization. Minimal osteophytes seen of the patellofemoral joint and medial compartment. Mild hyperostosis along the patella. No joint effusion on lateral view. IMPRESSION: Slight degenerative change Electronically Signed   By: Karen Kays M.D.   On: 04/23/2023 09:44    Procedures Procedures    Medications Ordered in ED Medications  acetaminophen (TYLENOL) tablet 1,000 mg (1,000 mg Oral Given 04/23/23 0923)  ibuprofen (ADVIL) tablet 600 mg (600 mg Oral Given 04/23/23 1610)    ED Course/ Medical Decision Making/ A&P Clinical Course as of 04/23/23 0957  Fri Apr 23, 2023  0949 Mild degenerative changes on XR otherwise normal. Will treat for ligamentous injury with knee immobilizer and crutches as needed and will give outpatient orthopedics follow up. [VK]    Clinical Course User Index [VK] Rexford Maus, DO                                 Medical Decision Making This patient presents to the ED with chief complaint(s) of L knee/leg pain with pertinent past medical history of CVA, asthma, fibromyalgia which further complicates the presenting complaint. The complaint involves an extensive differential diagnosis and also carries with it a high risk of complications and morbidity.    The differential diagnosis includes fracture, dislocation, bony lesion, sprain or ligamentous injury, no erythema and ROM intact making a septic joint unlikely, no evidence of neurovascular injury  Additional history obtained: Additional history obtained from spouse Records reviewed Care Everywhere/External Records  ED Course and Reassessment: On patient's arrival she is  hemodynamically stable in no acute distress.  Patient does have small knee joint effusion and pain with valgus and tenderness along the lateral knee joint concerning for ligamentous injury.  She will have a knee x-ray performed here and will be given Tylenol and Motrin for pain and will be closely reassessed.  Independent labs interpretation:  N/A  Independent visualization of imaging: - I independently visualized the following imaging with scope of interpretation limited to determining acute life threatening conditions related to emergency care: L knee XR, which revealed mild degenerative changes  Consultation: - Consulted or discussed management/test interpretation w/ external  professional: N/A  Consideration for admission or further workup: Patient has no emergent conditions requiring admission or further work-up at this time and is stable for discharge home with primary care and orthopedics follow-up  Social Determinants of health: N/A    Amount and/or Complexity of Data Reviewed Radiology: ordered.  Risk OTC drugs.          Final Clinical Impression(s) / ED Diagnoses Final diagnoses:  Sprain of left knee, unspecified ligament, initial encounter    Rx / DC Orders ED Discharge Orders     None         Rexford Maus, DO 04/23/23 2130

## 2023-04-23 NOTE — Progress Notes (Signed)
Orthopedic Tech Progress Note Patient Details:  Marissa Benton 11-Apr-1976 [id number]  Ortho Devices Type of Ortho Device: Crutches, Knee Immobilizer Ortho Device/Splint Location: LLE Ortho Device/Splint Interventions: Application, Ordered   Post Interventions Patient Tolerated: Well Instructions Provided: Adjustment of device, Care of device, Poper ambulation with device  Marilou Barnfield A Sotiria Keast 04/23/2023, 10:47 AM

## 2023-04-23 NOTE — ED Triage Notes (Signed)
C/o pain left knee and left lower leg, denies injury, states she leg started hurting 2 weeks ago was seen at [facility] told she prob had shin splints. Has appointment with her PCP 9/6 for follow up. States pain is worse and hurts to bear wt.

## 2023-05-04 DIAGNOSIS — M25562 Pain in left knee: Secondary | ICD-10-CM | POA: Diagnosis not present

## 2023-05-04 DIAGNOSIS — M7052 Other bursitis of knee, left knee: Secondary | ICD-10-CM | POA: Diagnosis not present

## 2023-05-07 ENCOUNTER — Encounter: Payer: Medicaid Other | Admitting: Student

## 2023-05-10 DIAGNOSIS — M25562 Pain in left knee: Secondary | ICD-10-CM | POA: Diagnosis not present

## 2023-05-17 DIAGNOSIS — M7052 Other bursitis of knee, left knee: Secondary | ICD-10-CM | POA: Diagnosis not present

## 2023-05-24 DIAGNOSIS — M7052 Other bursitis of knee, left knee: Secondary | ICD-10-CM | POA: Diagnosis not present

## 2023-05-25 DIAGNOSIS — M7052 Other bursitis of knee, left knee: Secondary | ICD-10-CM | POA: Diagnosis not present

## 2023-05-25 DIAGNOSIS — M1712 Unilateral primary osteoarthritis, left knee: Secondary | ICD-10-CM | POA: Diagnosis not present

## 2024-02-09 ENCOUNTER — Encounter: Payer: Self-pay | Admitting: *Deleted
